# Patient Record
Sex: Female | Born: 2000 | Race: Black or African American | Hispanic: No | Marital: Single | Smoking: Never smoker
Health system: Southern US, Community
[De-identification: ages and names within clinical notes are randomized; demographics above are authoritative.]

## PROBLEM LIST (undated history)

## (undated) DIAGNOSIS — M549 Dorsalgia, unspecified: Secondary | ICD-10-CM

## (undated) DIAGNOSIS — M2550 Pain in unspecified joint: Secondary | ICD-10-CM

## (undated) DIAGNOSIS — R030 Elevated blood-pressure reading, without diagnosis of hypertension: Secondary | ICD-10-CM

## (undated) DIAGNOSIS — E559 Vitamin D deficiency, unspecified: Secondary | ICD-10-CM

## (undated) DIAGNOSIS — E538 Deficiency of other specified B group vitamins: Secondary | ICD-10-CM

## (undated) DIAGNOSIS — L309 Dermatitis, unspecified: Secondary | ICD-10-CM

## (undated) HISTORY — DX: Vitamin D deficiency, unspecified: E55.9

## (undated) HISTORY — DX: Dermatitis, unspecified: L30.9

## (undated) HISTORY — DX: Deficiency of other specified B group vitamins: E53.8

## (undated) HISTORY — DX: Dorsalgia, unspecified: M54.9

## (undated) HISTORY — DX: Pain in unspecified joint: M25.50

## (undated) HISTORY — DX: Elevated blood-pressure reading, without diagnosis of hypertension: R03.0

---
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
## 2020-03-28 ENCOUNTER — Ambulatory Visit: Payer: Medicaid Other | Admitting: Family Medicine

## 2020-03-31 ENCOUNTER — Other Ambulatory Visit: Payer: Self-pay

## 2020-03-31 ENCOUNTER — Ambulatory Visit (INDEPENDENT_AMBULATORY_CARE_PROVIDER_SITE_OTHER): Payer: Medicaid Other | Admitting: Family Medicine

## 2020-03-31 ENCOUNTER — Encounter: Payer: Self-pay | Admitting: Family Medicine

## 2020-03-31 VITALS — BP 108/58 | HR 98 | Ht 66.0 in | Wt 208.2 lb

## 2020-03-31 DIAGNOSIS — L309 Dermatitis, unspecified: Secondary | ICD-10-CM | POA: Diagnosis not present

## 2020-03-31 DIAGNOSIS — Z1159 Encounter for screening for other viral diseases: Secondary | ICD-10-CM

## 2020-03-31 DIAGNOSIS — R42 Dizziness and giddiness: Secondary | ICD-10-CM

## 2020-03-31 DIAGNOSIS — E669 Obesity, unspecified: Secondary | ICD-10-CM | POA: Diagnosis not present

## 2020-03-31 MED ORDER — BETAMETHASONE VALERATE 0.1 % EX OINT: 1.0000 "application " | TOPICAL_OINTMENT | Freq: Two times a day (BID) | CUTANEOUS | 0 refills | Status: DC

## 2020-03-31 NOTE — Assessment & Plan Note (Signed)
Trial of Betamethasone. Med escribed. F/U soon if there is no improvement. I counseled her on lip sucking as a potential trigger for her peri-oral eczema. She will attempt to stop this habit. F/U as needed.

## 2020-03-31 NOTE — Progress Notes (Signed)
    SUBJECTIVE:   CHIEF COMPLAINT / HPI:  Dizziness This is a new problem. Episode onset: Started less than a year ago. The problem occurs intermittently. The problem has been waxing and waning. Pertinent negatives include no anorexia, chest pain, coughing, fever, headaches or vomiting. Associated symptoms comments: Symptoms occurs <3 times weekly, associated with hunger. Sometimes if she feels hot or dehydrated, she feels dizzy. Exacerbated by: Not eating. Treatments tried: She feels better when she eats or drink something cold and rest. Each episodes last for a few seconds to minutes.   Rash: C/O rash on her right posterior knee which has been there for a few months associated with intense itching. Pain from scratching. No exposure. She is out of her topical steroid cream for her eczema. Similar rash around her lower lips. She endorses lip sucking occasionally.  Weight management: Now in boxing class.  PERTINENT  PMH / PSH: PMX reviewed.  OBJECTIVE:   BP (!) 108/58   Pulse 98   Ht 5\' 6"  (1.676 m)   Wt 208 lb 4 oz (94.5 kg)   LMP 03/29/2020   SpO2 100%   BMI 33.61 kg/m   Physical Exam Vitals and nursing note reviewed.  Cardiovascular:     Rate and Rhythm: Normal rate and regular rhythm.     Pulses: Normal pulses.     Heart sounds: Normal heart sounds. No murmur heard.   Pulmonary:     Effort: Pulmonary effort is normal. No respiratory distress.     Breath sounds: Normal breath sounds. No wheezing or rhonchi.  Musculoskeletal:     Right lower leg: No edema.     Left lower leg: No edema.  Skin:    Comments: + dry, hyperpigmented patch on her shin. Large rough, hyperpigmented patch measuring 5 cm by 6 cm on her posterior right knee.        ASSESSMENT/PLAN:   Dizziness: She is currently asymptomatic. BP a bit low. Keep well hydrated and monitor BP closely at home. ?? Hypoglycemia vs anemia as a cause of her symptoms. CBC and Bmet checked today. I encouraged healthy  meal at regular interval. I will contact her with the result. She agreed with the plan.  Eczema Trial of Betamethasone. Med escribed. F/U soon if there is no improvement. I counseled her on lip sucking as a potential trigger for her peri-oral eczema. She will attempt to stop this habit. F/U as needed.  Obesity She had done well with weight loss. Now in a boxing class. I commended her on job well done for losing weight. Continue to monitor closely.    Hep C screening offered.  03/31/2020, MD [facility]

## 2020-03-31 NOTE — Assessment & Plan Note (Signed)
She had done well with weight loss. Now in a boxing class. I commended her on job well done for losing weight. Continue to monitor closely.

## 2020-03-31 NOTE — Patient Instructions (Signed)

## 2020-04-01 ENCOUNTER — Encounter: Payer: Self-pay | Admitting: Family Medicine

## 2020-04-01 LAB — BASIC METABOLIC PANEL
BUN/Creatinine Ratio: 12 (ref 9–23)
BUN: 9 mg/dL (ref 6–20)
CO2: 22 mmol/L (ref 20–29)
Calcium: 9.3 mg/dL (ref 8.7–10.2)
Chloride: 108 mmol/L — ABNORMAL HIGH (ref 96–106)
Creatinine, Ser: 0.74 mg/dL (ref 0.57–1.00)
GFR calc Af Amer: 136 mL/min/{1.73_m2} (ref >59)
GFR calc non Af Amer: 118 mL/min/{1.73_m2} (ref >59)
Glucose: 100 mg/dL — ABNORMAL HIGH (ref 65–99)
Potassium: 3.8 mmol/L (ref 3.5–5.2)
Sodium: 141 mmol/L (ref 134–144)

## 2020-04-01 LAB — CBC
Hematocrit: 37.1 % (ref 34.0–46.6)
Hemoglobin: 12.3 g/dL (ref 11.1–15.9)
MCH: 30.3 pg (ref 26.6–33.0)
MCHC: 33.2 g/dL (ref 31.5–35.7)
MCV: 91 fL (ref 79–97)
Platelets: 274 10*3/uL (ref 150–450)
RBC: 4.06 x10E6/uL (ref 3.77–5.28)
RDW: 12.2 % (ref 11.7–15.4)
WBC: 6.2 10*3/uL (ref 3.4–10.8)

## 2020-04-01 LAB — HEPATITIS C ANTIBODY: Hep C Virus Ab: <0.1 s/co ratio (ref 0.0–0.9)

## 2020-04-03 ENCOUNTER — Telehealth: Payer: Self-pay | Admitting: Family Medicine

## 2020-04-03 NOTE — Telephone Encounter (Signed)
Result discussed.  Glucose is ok.  Return soon if EKG if symptoms persists. She will contact me soon to schedule f/u appointment.

## 2020-04-04 ENCOUNTER — Encounter: Payer: Self-pay | Admitting: Family Medicine

## 2020-04-05 ENCOUNTER — Encounter: Payer: Self-pay | Admitting: Family Medicine

## 2020-04-05 NOTE — Telephone Encounter (Signed)
I called and spoke with the patient. She stated that she felt dizzy yesterday and that was why she wanted me to call. ED precaution discussed, otherwise, I will see her at her next appointment.  She agreed with the plan.

## 2020-04-07 ENCOUNTER — Encounter: Payer: Self-pay | Admitting: Family Medicine

## 2020-04-07 ENCOUNTER — Other Ambulatory Visit: Payer: Self-pay

## 2020-04-07 ENCOUNTER — Ambulatory Visit (HOSPITAL_COMMUNITY)
Admission: RE | Admit: 2020-04-07 | Discharge: 2020-04-07 | Disposition: A | Discharge status: Home / Self Care | Payer: Medicaid Other | Source: Ambulatory Visit | Attending: Family Medicine | Admitting: Family Medicine

## 2020-04-07 ENCOUNTER — Ambulatory Visit (INDEPENDENT_AMBULATORY_CARE_PROVIDER_SITE_OTHER): Payer: Medicaid Other | Admitting: Family Medicine

## 2020-04-07 VITALS — BP 110/72 | HR 59 | Wt 205.0 lb

## 2020-04-07 DIAGNOSIS — R55 Syncope and collapse: Secondary | ICD-10-CM

## 2020-04-07 DIAGNOSIS — R42 Dizziness and giddiness: Secondary | ICD-10-CM | POA: Insufficient documentation

## 2020-04-07 DIAGNOSIS — E539 Vitamin B deficiency, unspecified: Secondary | ICD-10-CM

## 2020-04-07 DIAGNOSIS — E8889 Other specified metabolic disorders: Secondary | ICD-10-CM

## 2020-04-07 DIAGNOSIS — R5383 Other fatigue: Secondary | ICD-10-CM | POA: Diagnosis not present

## 2020-04-07 DIAGNOSIS — K219 Gastro-esophageal reflux disease without esophagitis: Secondary | ICD-10-CM | POA: Diagnosis not present

## 2020-04-07 DIAGNOSIS — R1013 Epigastric pain: Secondary | ICD-10-CM | POA: Insufficient documentation

## 2020-04-07 DIAGNOSIS — E559 Vitamin D deficiency, unspecified: Secondary | ICD-10-CM

## 2020-04-07 MED ORDER — FAMOTIDINE 20 MG PO TABS: 20.0000 mg | ORAL_TABLET | Freq: Every day | ORAL | 1 refills | Status: DC

## 2020-04-07 NOTE — Patient Instructions (Signed)

## 2020-04-07 NOTE — Assessment & Plan Note (Signed)
Her symptoms is consistent with GERD. Trial of Pepcid. H. Pylori breath test done today. I will contact her with the result. Pepcid escribed.

## 2020-04-07 NOTE — Progress Notes (Signed)
° ° °  SUBJECTIVE:   CHIEF COMPLAINT / HPI:  Presyncope: Patient continues to have intermittent light headedness as if she will faint. She denies chest pain, no SOB at the time. Her last episode was about a week ago. No neuro symptoms. She  Denies dizziness this morning. She feels weak sometimes.  Abdominal pain:C/O epigastric pain on and off for months. Worsens when she skips her meal. Denies N/V, no change in her bowel habit. She described pain as burning and aching in nature. There is associated poor appetite.   PERTINENT  PMH / PSH: PMX reviewed.  OBJECTIVE:   BP 110/72    Pulse (!) 59    Wt 205 lb (93 kg)    LMP 03/29/2020    BMI 33.09 kg/m   Physical Exam Vitals and nursing note reviewed.  Cardiovascular:     Rate and Rhythm: Normal rate and regular rhythm.     Heart sounds: Normal heart sounds. No murmur heard.   Pulmonary:     Effort: Pulmonary effort is normal. No respiratory distress.     Breath sounds: Normal breath sounds. No wheezing.  Abdominal:     General: Abdomen is flat. Bowel sounds are normal. There is no distension.     Palpations: Abdomen is soft. There is no mass.     Tenderness: There is no abdominal tenderness.  Neurological:     General: No focal deficit present.     Mental Status: She is oriented to person, place, and time.     Coordination: Coordination normal.     Gait: Gait normal.      ASSESSMENT/PLAN:   Dizziness BP looks good. Recent Bmet and Hemoglobin looks good. HR slow.  EKG dne today reviewed by me showed sinus brady. ?? Dizziness related to her heart rate. Cardiology evaluation and referral recommended. She declined cards eval or referral for now. She will reconsider this if her symptoms persists or worsens. She will contact or office via phone/MyChart if she has any questions or go to the ED. TSH, Vitamin B12 and D checked today given hx of fatigue and weakness. I will her soon with her result.  GERD (gastroesophageal reflux  disease) Her symptoms is consistent with GERD. Trial of Pepcid. H. Pylori breath test done today. I will contact her with the result. Pepcid escribed.     Janit Pagan, MD [facility]

## 2020-04-07 NOTE — Assessment & Plan Note (Addendum)
BP looks good. Recent Bmet and Hemoglobin looks good. HR slow.  EKG dne today reviewed by me showed sinus brady. ?? Dizziness related to her heart rate. Cardiology evaluation and referral recommended. She declined cards eval or referral for now. She will reconsider this if her symptoms persists or worsens. She will contact or office via phone/MyChart if she has any questions or go to the ED. TSH, Vitamin B12 and D checked today given hx of fatigue and weakness. I will her soon with her result.

## 2020-04-08 ENCOUNTER — Encounter: Payer: Self-pay | Admitting: Family Medicine

## 2020-04-08 ENCOUNTER — Other Ambulatory Visit: Payer: Self-pay | Admitting: Family Medicine

## 2020-04-08 DIAGNOSIS — E559 Vitamin D deficiency, unspecified: Secondary | ICD-10-CM | POA: Insufficient documentation

## 2020-04-08 DIAGNOSIS — E539 Vitamin B deficiency, unspecified: Secondary | ICD-10-CM | POA: Insufficient documentation

## 2020-04-08 LAB — H. PYLORI BREATH TEST: H pylori Breath Test: NEGATIVE

## 2020-04-08 LAB — VITAMIN B12: Vitamin B-12: 194 pg/mL — ABNORMAL LOW (ref 232–1245)

## 2020-04-08 LAB — VITAMIN D 25 HYDROXY (VIT D DEFICIENCY, FRACTURES): Vit D, 25-Hydroxy: 9.6 ng/mL — ABNORMAL LOW (ref 30.0–100.0)

## 2020-04-08 LAB — TSH: TSH: 0.931 u[IU]/mL (ref 0.450–4.500)

## 2020-04-08 MED ORDER — VITAMIN D (ERGOCALCIFEROL) 1.25 MG (50000 UNIT) PO CAPS: 50000.0000 [IU] | ORAL_CAPSULE | ORAL | 1 refills | Status: DC

## 2020-04-10 ENCOUNTER — Telehealth: Payer: Self-pay | Admitting: *Deleted

## 2020-04-10 ENCOUNTER — Other Ambulatory Visit: Payer: Self-pay | Admitting: Family Medicine

## 2020-04-10 MED ORDER — CYANOCOBALAMIN 500 MCG PO TABS
500.0000 ug | ORAL_TABLET | Freq: Every day | ORAL | 1 refills | Status: DC
Start: 2020-04-10 — End: 2020-06-28

## 2020-04-10 NOTE — Telephone Encounter (Signed)
Attempted to call patient to schedule nurse visit, but no answer and voicemail is not set up.  Will send her a [employer].  Verle Brillhart,CMA

## 2020-04-10 NOTE — Telephone Encounter (Signed)
Spoke with patient regarding her options for B12.  She would like to try taking oral B12 until she comes in for her follow up in 2 months.  Patient is going to consider the injections and will also check with work on getting the time off needed for weekly/monthly visits.  Patient would like to have a script sent to her pharmacy of the desired dose she should be taking of the oral medication.  Will forward to MD.  Pharmacy confirmed.  Jaisean Monteforte,CMA

## 2020-04-10 NOTE — Telephone Encounter (Signed)
-----   Message from Doreene Eland, MD sent at 04/08/2020  9:35 AM EDT ----- Please contact patient to schedule Vitamin B injections.  Vitamin B 12 1000 mcg IM  every week x 2 weeks and then schedule monthly.

## 2020-04-10 NOTE — Telephone Encounter (Signed)
Called patient but was unable to reach due to her being at work.  Sent her a [employer].  Edahi Kroening,CMA

## 2020-04-10 NOTE — Telephone Encounter (Signed)
Her level is too low for oral Vitamin B. However if she prefers that, she can get med OTC.  The OTC oral vitamin B12 is dietary supplement , not sufficient to treat vitamin B12 deficiency.

## 2020-04-10 NOTE — Telephone Encounter (Signed)
Spoke with patient via mychart and she would like to know if she can get a script for oral B12 vs the injections.  Chonita Gadea,CMA

## 2020-04-10 NOTE — Telephone Encounter (Signed)
I called and discussed with the patient.  I gave her a monthly vitamin B12 injection instead of weekly x 2 weeks given the concern about her work schedule. She still prefers to trial oral supplement which is mainly for maintenance dosing.  I escribed Cyanocobalamin 500 mcg qd to her pharmacy. She will f/u in 1 month for repeat lab.

## 2020-04-21 ENCOUNTER — Encounter: Payer: Self-pay | Admitting: Family Medicine

## 2020-05-30 ENCOUNTER — Ambulatory Visit: Payer: Medicaid Other | Admitting: Family Medicine

## 2020-06-15 ENCOUNTER — Other Ambulatory Visit: Payer: Self-pay | Admitting: Family Medicine

## 2020-06-28 ENCOUNTER — Other Ambulatory Visit: Payer: Self-pay | Admitting: Family Medicine

## 2020-07-29 ENCOUNTER — Other Ambulatory Visit: Payer: Self-pay | Admitting: Family Medicine

## 2020-08-08 ENCOUNTER — Ambulatory Visit (INDEPENDENT_AMBULATORY_CARE_PROVIDER_SITE_OTHER): Payer: Medicaid Other | Admitting: Family Medicine

## 2020-08-08 ENCOUNTER — Encounter: Payer: Self-pay | Admitting: Family Medicine

## 2020-08-08 ENCOUNTER — Other Ambulatory Visit: Payer: Self-pay

## 2020-08-08 VITALS — BP 100/60 | HR 79 | Ht 66.0 in | Wt 206.1 lb

## 2020-08-08 DIAGNOSIS — Z23 Encounter for immunization: Secondary | ICD-10-CM | POA: Diagnosis not present

## 2020-08-08 DIAGNOSIS — R413 Other amnesia: Secondary | ICD-10-CM

## 2020-08-08 DIAGNOSIS — Z0000 Encounter for general adult medical examination without abnormal findings: Secondary | ICD-10-CM | POA: Diagnosis not present

## 2020-08-08 DIAGNOSIS — E539 Vitamin B deficiency, unspecified: Secondary | ICD-10-CM | POA: Diagnosis not present

## 2020-08-08 DIAGNOSIS — E559 Vitamin D deficiency, unspecified: Secondary | ICD-10-CM

## 2020-08-08 DIAGNOSIS — Z113 Encounter for screening for infections with a predominantly sexual mode of transmission: Secondary | ICD-10-CM

## 2020-08-08 NOTE — Progress Notes (Signed)
Subjective:     Rose Butler is a 20 y.o. female and is here for a comprehensive physical exam. The patient reports problems - memory issue x 6 months. Gradually worsening..  Forgets small details> No recent stress. Associated with headaches daily - Hx of Migraine headache which is similar to her current headache.  Concern about eating disorder. Feels like she does not have adequate calorie intake due to mental stress regarding her weight. Social History   Socioeconomic History  . Marital status: Single    Spouse name: Not on file  . Number of children: Not on file  . Years of education: Not on file  . Highest education level: Not on file  Occupational History  . Occupation: [employer]  Tobacco Use  . Smoking status: Never Smoker  . Smokeless tobacco: Never Used  Vaping Use  . Vaping Use: Never used  Substance and Sexual Activity  . Alcohol use: No  . Drug use: No  . Sexual activity: Not on file  Other Topics Concern  . Not on file  Social History Narrative   Lives with mom, sister and niece   Social Determinants of Health   Financial Resource Strain: Not on file  Food Insecurity: Not on file  Transportation Needs: Not on file  Physical Activity: Not on file  Stress: Not on file  Social Connections: Not on file  Intimate Partner Violence: Not on file   Health Maintenance  Topic Date Due  . INFLUENZA VACCINE  09/07/2020 (Originally 01/09/2020)  . COVID-19 Vaccine (1) 11/10/2020 (Originally 01/28/2006)  . TETANUS/TDAP  02/16/2022  . HPV VACCINES  Completed  . Hepatitis C Screening  Completed  . HIV Screening  Completed    The following portions of the patient's history were reviewed and updated as appropriate: allergies, current medications, past family history, past medical history, past social history, past surgical history and problem list.  Review of Systems Pertinent items noted in HPI and remainder of comprehensive ROS otherwise negative.   Objective:    BP  100/60   Pulse 79   Ht 5\' 6"  (1.676 m)   Wt 206 lb 2 oz (93.5 kg)   LMP 07/18/2020   SpO2 99%   BMI 33.27 kg/m  General appearance: alert and cooperative Head: Normocephalic, without obvious abnormality, atraumatic Eyes: conjunctivae/corneas clear. PERRL, EOM's intact. Fundi benign. Ears: normal TM's and external ear canals both ears Throat: lips, mucosa, and tongue normal; teeth and gums normal Neck: no adenopathy, no carotid bruit, no JVD, supple, symmetrical, trachea midline and thyroid not enlarged, symmetric, no tenderness/mass/nodules Lungs: clear to auscultation bilaterally Heart: regular rate and rhythm, S1, S2 normal, no murmur, click, rub or gallop Abdomen: soft, non-tender; bowel sounds normal; no masses,  no organomegaly Extremities: extremities normal, atraumatic, no cyanosis or edema Lymph nodes: Cervical, supraclavicular, and axillary nodes normal. Neurologic: Alert and oriented X 3, normal strength and tone. Normal symmetric reflexes. Normal coordination and gait Mental status: Alert, oriented, thought content appropriate Sensory: normal Gait: Normal    Flowsheet Row Office Visit from 08/08/2020 in [facility]  PHQ-9 Total Score 3      Assessment:    Healthy female exam.     Migraine: Chronic recurrent Memory concern: New Eating disorder Plan:  Otherwise normal well exam. Flu shot offered and was given today. Weight management reviewed.  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 She will benefit from CBT which she is interested in. She will like  to get connect with Dr. Shawnee Knapp. I will message Shawnee Knapp to connect with this patient.  Memory issue: Mild May be stress or anxiety related. No neurologic deficit on exam RPR checked as well as Vit B12 and D given previous deficiencies. She had a recently normal TSH and HIV test, hence, not repeated. Consider imaging if lab is none revealing. She agreed with the  plan.   See After Visit Summary for Counseling Recommendations

## 2020-08-08 NOTE — Patient Instructions (Signed)
It was nice seeing you today. For your chronic migraine headache, please trial Magnesium Oxide 400 mg daily as needed. You can get this medicine over the counter. Please call soon or go to the emergency of the headache worsens. I will call you soon regarding your test results.

## 2020-08-09 ENCOUNTER — Telehealth: Payer: Self-pay | Admitting: Family Medicine

## 2020-08-09 LAB — RPR: RPR Ser Ql: NONREACTIVE

## 2020-08-09 LAB — VITAMIN B12: Vitamin B-12: 769 pg/mL (ref 232–1245)

## 2020-08-09 LAB — VITAMIN D 25 HYDROXY (VIT D DEFICIENCY, FRACTURES): Vit D, 25-Hydroxy: 29.1 ng/mL — ABNORMAL LOW (ref 30.0–100.0)

## 2020-08-09 NOTE — Telephone Encounter (Signed)
Second attempt to reach patient.  I was unable to leave a voice message.  Should she call back, please, advise her that her vitamin B12 level is back to normal.  Vitamin D improved some.  RPR for syphilis screening is still pending.  Plan: May use OTC MVI May use OTC vitamin D daily supplement. F/U soon if still having memory issues or wants to proceed with an MRI of the brain.

## 2020-08-09 NOTE — Telephone Encounter (Signed)
Unable to leave a message.   Will call back to discuss result.

## 2020-08-09 NOTE — Telephone Encounter (Signed)
Patient returns PCP to nurse line. Patient advised of results and OTC vitamin recommendations. Patient advised RPR is still pending and will reach out with results as soon as they are back.

## 2020-08-13 ENCOUNTER — Other Ambulatory Visit: Payer: Self-pay | Admitting: Family Medicine

## 2020-08-25 ENCOUNTER — Ambulatory Visit: Payer: Medicaid Other | Admitting: Family Medicine

## 2020-08-29 ENCOUNTER — Encounter: Payer: Self-pay | Admitting: Family Medicine

## 2020-08-29 ENCOUNTER — Ambulatory Visit (INDEPENDENT_AMBULATORY_CARE_PROVIDER_SITE_OTHER): Payer: Medicaid Other | Admitting: Family Medicine

## 2020-08-29 ENCOUNTER — Other Ambulatory Visit: Payer: Self-pay

## 2020-08-29 DIAGNOSIS — N62 Hypertrophy of breast: Secondary | ICD-10-CM

## 2020-08-29 DIAGNOSIS — J301 Allergic rhinitis due to pollen: Secondary | ICD-10-CM

## 2020-08-29 DIAGNOSIS — M25519 Pain in unspecified shoulder: Secondary | ICD-10-CM

## 2020-08-29 DIAGNOSIS — L309 Dermatitis, unspecified: Secondary | ICD-10-CM | POA: Diagnosis not present

## 2020-08-29 MED ORDER — CYCLOBENZAPRINE HCL 5 MG PO TABS: 5.0000 mg | ORAL_TABLET | Freq: Two times a day (BID) | ORAL | 1 refills | Status: DC | PRN

## 2020-08-29 MED ORDER — LEVOCETIRIZINE DIHYDROCHLORIDE 5 MG PO TABS: 5.0000 mg | ORAL_TABLET | Freq: Every evening | ORAL | 1 refills | Status: DC

## 2020-08-29 MED ORDER — BETAMETHASONE VALERATE 0.1 % EX OINT: 1.0000 "application " | TOPICAL_OINTMENT | Freq: Two times a day (BID) | CUTANEOUS | 3 refills | Status: DC

## 2020-08-29 NOTE — Progress Notes (Signed)
    SUBJECTIVE:   CHIEF COMPLAINT / HPI:   Shoulder pain: Here for f/u. Hx of b/L shoulder pain worse on the left. She attributed her pain to the heaviness of her breast. Pain right now is about 4/10 in severity. Pain can be worsened by activity. No recent trauma.  Eczema/Allergy: Requesting medication refill - Betamethasone. Her sinus problem is acting up due to pollen exposure. She tired Claritin and Zyrtec in the past with no improvement. She experiences stuffy nose, HA sometimes.  PERTINENT  PMH / PSH: PMX reviewed.  OBJECTIVE:   BP 118/65   Pulse 90   Ht 5\' 6"  (1.676 m)   Wt 213 lb 3.2 oz (96.7 kg)   LMP 08/15/2020   SpO2 99%   BMI 34.41 kg/m   Physical Exam Vitals and nursing note reviewed.  Cardiovascular:     Rate and Rhythm: Normal rate and regular rhythm.     Heart sounds: Normal heart sounds. No murmur heard.   Pulmonary:     Effort: Pulmonary effort is normal. No respiratory distress.     Breath sounds: Normal breath sounds. No wheezing.  Abdominal:     General: Abdomen is flat. Bowel sounds are normal.     Palpations: Abdomen is soft.     Tenderness: There is no abdominal tenderness.  Musculoskeletal:     Right shoulder: Normal.     Left shoulder: No swelling or deformity. Normal range of motion.     Comments: Mild tenderness and tenseness of her left trapezius.       ASSESSMENT/PLAN:   Pain in joint, shoulder region B/L due to over use - heavy breast PT referral discussed - she would consider in the future Home shoulder exercise handout provided. Use Tylenol as needed for pain. Flexeril escribed prn muscle spasm and pain. Need referral to surgery for breast reduction which will improve her quality of life.  Macromastia Referred to plastic surgery in the past. She was unable to proceed with breast reduction due to insurance restriction. She will call her insurance regarding referral and let me know.  Eczema I refilled her Betamethasone.  F/U  as needed.  Allergic rhinitis Trial Xyzal. Med escribed. F/U soon.     10/15/2020, MD [facility]

## 2020-08-29 NOTE — Assessment & Plan Note (Signed)
Trial Xyzal. Med escribed. F/U soon.

## 2020-08-29 NOTE — Assessment & Plan Note (Signed)
I refilled her Betamethasone.  F/U as needed.

## 2020-08-29 NOTE — Assessment & Plan Note (Addendum)
B/L due to over use - heavy breast PT referral discussed - she would consider in the future Home shoulder exercise handout provided. Use Tylenol as needed for pain. Flexeril escribed prn muscle spasm and pain. Need referral to surgery for breast reduction which will improve her quality of life.

## 2020-08-29 NOTE — Assessment & Plan Note (Signed)
Referred to plastic surgery in the past. She was unable to proceed with breast reduction due to insurance restriction. She will call her insurance regarding referral and let me know.

## 2020-08-29 NOTE — Patient Instructions (Signed)
Shoulder Exercises Ask your health care provider which exercises are safe for you. Do exercises exactly as told by your health care provider and adjust them as directed. It is normal to feel mild stretching, pulling, tightness, or discomfort as you do these exercises. Stop right away if you feel sudden pain or your pain gets worse. Do not begin these exercises until told by your health care provider. Stretching exercises External rotation and abduction This exercise is sometimes called corner stretch. This exercise rotates your arm outward (external rotation) and moves your arm out from your body (abduction). 1. Stand in a doorway with one of your feet slightly in front of the other. This is called a staggered stance. If you cannot reach your forearms to the door frame, stand facing a corner of a room. 2. Choose one of the following positions as told by your health care provider: ? Place your hands and forearms on the door frame above your head. ? Place your hands and forearms on the door frame at the height of your head. ? Place your hands on the door frame at the height of your elbows. 3. Slowly move your weight onto your front foot until you feel a stretch across your chest and in the front of your shoulders. Keep your head and chest upright and keep your abdominal muscles tight. 4. Hold for __________ seconds. 5. To release the stretch, shift your weight to your back foot. Repeat __________ times. Complete this exercise __________ times a day.   Extension, standing 1. Stand and hold a broomstick, a cane, or a similar object behind your back. ? Your hands should be a little wider than shoulder width apart. ? Your palms should face away from your back. 2. Keeping your elbows straight and your shoulder muscles relaxed, move the stick away from your body until you feel a stretch in your shoulders (extension). ? Avoid shrugging your shoulders while you move the stick. Keep your shoulder blades  tucked down toward the middle of your back. 3. Hold for __________ seconds. 4. Slowly return to the starting position. Repeat __________ times. Complete this exercise __________ times a day. Range-of-motion exercises Pendulum 1. Stand near a wall or a surface that you can hold onto for balance. 2. Bend at the waist and let your left / right arm hang straight down. Use your other arm to support you. Keep your back straight and do not lock your knees. 3. Relax your left / right arm and shoulder muscles, and move your hips and your trunk so your left / right arm swings freely. Your arm should swing because of the motion of your body, not because you are using your arm or shoulder muscles. 4. Keep moving your hips and trunk so your arm swings in the following directions, as told by your health care provider: ? Side to side. ? Forward and backward. ? In clockwise and counterclockwise circles. 5. Continue each motion for __________ seconds, or for as long as told by your health care provider. 6. Slowly return to the starting position. Repeat __________ times. Complete this exercise __________ times a day.   Shoulder flexion, standing 1. Stand and hold a broomstick, a cane, or a similar object. Place your hands a little more than shoulder width apart on the object. Your left / right hand should be palm up, and your other hand should be palm down. 2. Keep your elbow straight and your shoulder muscles relaxed. Push the stick up with your healthy   arm to raise your left / right arm in front of your body, and then over your head until you feel a stretch in your shoulder (flexion). ? Avoid shrugging your shoulder while you raise your arm. Keep your shoulder blade tucked down toward the middle of your back. 3. Hold for __________ seconds. 4. Slowly return to the starting position. Repeat __________ times. Complete this exercise __________ times a day.   Shoulder abduction, standing 1. Stand and hold a  broomstick, a cane, or a similar object. Place your hands a little more than shoulder width apart on the object. Your left / right hand should be palm up, and your other hand should be palm down. 2. Keep your elbow straight and your shoulder muscles relaxed. Push the object across your body toward your left / right side. Raise your left / right arm to the side of your body (abduction) until you feel a stretch in your shoulder. ? Do not raise your arm above shoulder height unless your health care provider tells you to do that. ? If directed, raise your arm over your head. ? Avoid shrugging your shoulder while you raise your arm. Keep your shoulder blade tucked down toward the middle of your back. 3. Hold for __________ seconds. 4. Slowly return to the starting position. Repeat __________ times. Complete this exercise __________ times a day. Internal rotation 1. Place your left / right hand behind your back, palm up. 2. Use your other hand to dangle an exercise band, a towel, or a similar object over your shoulder. Grasp the band with your left / right hand so you are holding on to both ends. 3. Gently pull up on the band until you feel a stretch in the front of your left / right shoulder. The movement of your arm toward the center of your body is called internal rotation. ? Avoid shrugging your shoulder while you raise your arm. Keep your shoulder blade tucked down toward the middle of your back. 4. Hold for __________ seconds. 5. Release the stretch by letting go of the band and lowering your hands. Repeat __________ times. Complete this exercise __________ times a day.   Strengthening exercises External rotation 1. Sit in a stable chair without armrests. 2. Secure an exercise band to a stable object at elbow height on your left / right side. 3. Place a soft object, such as a folded towel or a small pillow, between your left / right upper arm and your body to move your elbow about 4 inches (10  cm) away from your side. 4. Hold the end of the exercise band so it is tight and there is no slack. 5. Keeping your elbow pressed against the soft object, slowly move your forearm out, away from your abdomen (external rotation). Keep your body steady so only your forearm moves. 6. Hold for __________ seconds. 7. Slowly return to the starting position. Repeat __________ times. Complete this exercise __________ times a day.   Shoulder abduction 1. Sit in a stable chair without armrests, or stand up. 2. Hold a __________ weight in your left / right hand, or hold an exercise band with both hands. 3. Start with your arms straight down and your left / right palm facing in, toward your body. 4. Slowly lift your left / right hand out to your side (abduction). Do not lift your hand above shoulder height unless your health care provider tells you that this is safe. ? Keep your arms straight. ? Avoid   shrugging your shoulder while you do this movement. Keep your shoulder blade tucked down toward the middle of your back. 5. Hold for __________ seconds. 6. Slowly lower your arm, and return to the starting position. Repeat __________ times. Complete this exercise __________ times a day.   Shoulder extension 1. Sit in a stable chair without armrests, or stand up. 2. Secure an exercise band to a stable object in front of you so it is at shoulder height. 3. Hold one end of the exercise band in each hand. Your palms should face each other. 4. Straighten your elbows and lift your hands up to shoulder height. 5. Step back, away from the secured end of the exercise band, until the band is tight and there is no slack. 6. Squeeze your shoulder blades together as you pull your hands down to the sides of your thighs (extension). Stop when your hands are straight down by your sides. Do not let your hands go behind your body. 7. Hold for __________ seconds. 8. Slowly return to the starting position. Repeat __________  times. Complete this exercise __________ times a day. Shoulder row 1. Sit in a stable chair without armrests, or stand up. 2. Secure an exercise band to a stable object in front of you so it is at waist height. 3. Hold one end of the exercise band in each hand. Position your palms so that your thumbs are facing the ceiling (neutral position). 4. Bend each of your elbows to a 90-degree angle (right angle) and keep your upper arms at your sides. 5. Step back until the band is tight and there is no slack. 6. Slowly pull your elbows back behind you. 7. Hold for __________ seconds. 8. Slowly return to the starting position. Repeat __________ times. Complete this exercise __________ times a day. Shoulder press-ups 1. Sit in a stable chair that has armrests. Sit upright, with your feet flat on the floor. 2. Put your hands on the armrests so your elbows are bent and your fingers are pointing forward. Your hands should be about even with the sides of your body. 3. Push down on the armrests and use your arms to lift yourself off the chair. Straighten your elbows and lift yourself up as much as you comfortably can. ? Move your shoulder blades down, and avoid letting your shoulders move up toward your ears. ? Keep your feet on the ground. As you get stronger, your feet should support less of your body weight as you lift yourself up. 4. Hold for __________ seconds. 5. Slowly lower yourself back into the chair. Repeat __________ times. Complete this exercise __________ times a day.   Wall push-ups 1. Stand so you are facing a stable wall. Your feet should be about one arm-length away from the wall. 2. Lean forward and place your palms on the wall at shoulder height. 3. Keep your feet flat on the floor as you bend your elbows and lean forward toward the wall. 4. Hold for __________ seconds. 5. Straighten your elbows to push yourself back to the starting position. Repeat __________ times. Complete this  exercise __________ times a day.   This information is not intended to replace advice given to you by your health care provider. Make sure you discuss any questions you have with your health care provider. Document Revised: 09/18/2018 Document Reviewed: 06/26/2018 Elsevier Patient Education  2021 [employer]  

## 2020-09-30 ENCOUNTER — Other Ambulatory Visit: Payer: Self-pay | Admitting: Family Medicine

## 2021-07-11 ENCOUNTER — Other Ambulatory Visit: Payer: Self-pay

## 2021-07-11 ENCOUNTER — Encounter: Payer: Self-pay | Admitting: Family Medicine

## 2021-07-11 ENCOUNTER — Ambulatory Visit (INDEPENDENT_AMBULATORY_CARE_PROVIDER_SITE_OTHER): Payer: Medicaid Other | Admitting: Family Medicine

## 2021-07-11 VITALS — BP 132/84 | HR 78 | Ht 66.0 in | Wt 202.4 lb

## 2021-07-11 DIAGNOSIS — N939 Abnormal uterine and vaginal bleeding, unspecified: Secondary | ICD-10-CM | POA: Diagnosis not present

## 2021-07-11 DIAGNOSIS — H547 Unspecified visual loss: Secondary | ICD-10-CM

## 2021-07-11 DIAGNOSIS — E559 Vitamin D deficiency, unspecified: Secondary | ICD-10-CM | POA: Diagnosis not present

## 2021-07-11 DIAGNOSIS — H6123 Impacted cerumen, bilateral: Secondary | ICD-10-CM | POA: Diagnosis not present

## 2021-07-11 DIAGNOSIS — E538 Deficiency of other specified B group vitamins: Secondary | ICD-10-CM

## 2021-07-11 DIAGNOSIS — E539 Vitamin B deficiency, unspecified: Secondary | ICD-10-CM

## 2021-07-11 NOTE — Progress Notes (Signed)
° ° °  SUBJECTIVE:   CHIEF COMPLAINT / HPI:   Hearing/Vision issue: C/O muffled hearing in her left ear x 1 month. No pain, no ear discharge. She tries to clean her ears with q-tips with minimal improvement. She also c/o poor vision x 2 months, especially with distance. She denies ear or eye pain.  Vitamin Def: She d/ced her vitamin D and B12 supplements. She is here for lab check.  Irregular period: She had a one-time episode of spotting on 1.28/23 but none since then. She denies pelvic or abdominal pain. She is sexually active only with a female partner and assured me she is not pregnant.   PERTINENT  PMH / PSH: PMHx reviewed  OBJECTIVE:   Vitals:   07/11/21 0843 07/11/21 0907  BP: 134/90 132/84  Pulse: 78   SpO2: 99%   Weight: 202 lb 6.4 oz (91.8 kg)   Height: 5\' 6"  (1.676 m)     Physical Exam Vitals and nursing note reviewed.  HENT:     Head:     Comments: B/L cerumen impaction worse on he right. Otherwise, normal ear canals Eyes:     Extraocular Movements: Extraocular movements intact.     Conjunctiva/sclera: Conjunctivae normal.     Pupils: Pupils are equal, round, and reactive to light.  Cardiovascular:     Rate and Rhythm: Normal rate and regular rhythm.     Heart sounds: Normal heart sounds. No murmur heard. Pulmonary:     Effort: Pulmonary effort is normal. No respiratory distress.     Breath sounds: Normal breath sounds. No wheezing.  Abdominal:     General: Abdomen is flat. Bowel sounds are normal. There is no distension.     Palpations: Abdomen is soft. There is no mass.     Tenderness: There is no abdominal tenderness.   Hearing Screening   500Hz  1000Hz  2000Hz  4000Hz   Right ear  Pass Pass   Left ear Pass Pass Pass Pass   Vision Screening   Right eye Left eye Both eyes  Without correction 20/25 20/25 20/20   With correction        ASSESSMENT/PLAN:  Vision problem Visual acuity looks good. Referral placed to ophthalmology for  reassessment.  Cerumen impaction Worse on the right although she is more symptomatic on the left. Ear lavage completed and her symptoms improved afterward. F/U as needed.  Vitamin B deficiency Lab checked. I will contact her with the result.  Vitamin D deficiency Lab checked. I will contact her with the result.    Vaginal spotting: One episode. She is currently asymptomatic. Urine preg not requirement. Monitor for now. Plan to obtain pelvic U/S and testing in the future if this reoccurs.  She declined COVID-19 and flu shots  , MD [facility]

## 2021-07-11 NOTE — Assessment & Plan Note (Signed)
Lab checked. I will contact her with the result.

## 2021-07-11 NOTE — Patient Instructions (Signed)
Earwax Buildup, Adult ?The ears produce a substance called earwax that helps keep bacteria out of the ear and protects the skin in the ear canal. Occasionally, earwax can build up in the ear and cause discomfort or hearing loss. ?What are the causes? ?This condition is caused by a buildup of earwax. Ear canals are self-cleaning. Ear wax is made in the outer part of the ear canal and generally falls out in small amounts over time. ?When the self-cleaning mechanism is not working, earwax builds up and can cause decreased hearing and discomfort. Attempting to clean ears with cotton swabs can push the earwax deep into the ear canal and cause decreased hearing and pain. ?What increases the risk? ?This condition is more likely to develop in people who: ?Clean their ears often with cotton swabs. ?Pick at their ears. ?Use earplugs or in-ear headphones often, or wear hearing aids. ?The following factors may also make you more likely to develop this condition: ?Being female. ?Being of older age. ?Naturally producing more earwax. ?Having narrow ear canals. ?Having earwax that is overly thick or sticky. ?Having excess hair in the ear canal. ?Having eczema. ?Being dehydrated. ?What are the signs or symptoms? ?Symptoms of this condition include: ?Reduced or muffled hearing. ?A feeling of fullness in the ear or feeling that the ear is plugged. ?Fluid coming from the ear. ?Ear pain or an itchy ear. ?Ringing in the ear. ?Coughing. ?Balance problems. ?An obvious piece of earwax that can be seen inside the ear canal. ?How is this diagnosed? ?This condition may be diagnosed based on: ?Your symptoms. ?Your medical history. ?An ear exam. During the exam, your health care provider will look into your ear with an instrument called an otoscope. ?You may have tests, including a hearing test. ?How is this treated? ?This condition may be treated by: ?Using ear drops to soften the earwax. ?Having the earwax removed by a health care provider. The  health care provider may: ?Flush the ear with water. ?Use an instrument that has a loop on the end (curette). ?Use a suction device. ?Having surgery to remove the wax buildup. This may be done in severe cases. ?Follow these instructions at home: ? ?Take over-the-counter and prescription medicines only as told by your health care provider. ?Do not put any objects, including cotton swabs, into your ear. You can clean the opening of your ear canal with a washcloth or facial tissue. ?Follow instructions from your health care provider about cleaning your ears. Do not overclean your ears. ?Drink enough fluid to keep your urine pale yellow. This will help to thin the earwax. ?Keep all follow-up visits as told. If earwax builds up in your ears often or if you use hearing aids, consider seeing your health care provider for routine, preventive ear cleanings. Ask your health care provider how often you should schedule your cleanings. ?If you have hearing aids, clean them according to instructions from the manufacturer and your health care provider. ?Contact a health care provider if: ?You have ear pain. ?You develop a fever. ?You have pus or other fluid coming from your ear. ?You have hearing loss. ?You have ringing in your ears that does not go away. ?You feel like the room is spinning (vertigo). ?Your symptoms do not improve with treatment. ?Get help right away if: ?You have bleeding from the affected ear. ?You have severe ear pain. ?Summary ?Earwax can build up in the ear and cause discomfort or hearing loss. ?The most common symptoms of this condition include   reduced or muffled hearing, a feeling of fullness in the ear, or feeling that the ear is plugged. ?This condition may be diagnosed based on your symptoms, your medical history, and an ear exam. ?This condition may be treated by using ear drops to soften the earwax or by having the earwax removed by a health care provider. ?Do not put any objects, including cotton  swabs, into your ear. You can clean the opening of your ear canal with a washcloth or facial tissue. ?This information is not intended to replace advice given to you by your health care provider. Make sure you discuss any questions you have with your health care provider. ?Document Revised: 09/14/2019 Document Reviewed: 09/14/2019 ?Elsevier Patient Education ? 2022 Elsevier Inc. ? ?

## 2021-07-11 NOTE — Assessment & Plan Note (Signed)
Lab checked. °I will contact her with the result. °

## 2021-07-12 ENCOUNTER — Encounter: Payer: Self-pay | Admitting: Family Medicine

## 2021-07-12 ENCOUNTER — Telehealth: Payer: Self-pay | Admitting: Family Medicine

## 2021-07-12 LAB — VITAMIN D 25 HYDROXY (VIT D DEFICIENCY, FRACTURES): Vit D, 25-Hydroxy: 12.4 ng/mL — ABNORMAL LOW (ref 30.0–100.0)

## 2021-07-12 LAB — VITAMIN B12: Vitamin B-12: 270 pg/mL (ref 232–1245)

## 2021-07-12 MED ORDER — VITAMIN D (ERGOCALCIFEROL) 1.25 MG (50000 UNIT) PO CAPS: 50000.0000 [IU] | ORAL_CAPSULE | ORAL | 1 refills | Status: DC

## 2021-07-12 MED ORDER — VITAMIN B-12 500 MCG PO TABS: 500.0000 ug | ORAL_TABLET | Freq: Every day | ORAL | 1 refills | Status: DC

## 2021-07-12 NOTE — Telephone Encounter (Signed)
I was unable to leave a message on her mobile phone listed.  HIPAA compliant callback message left on her home phone.   Note: Advise her when she calls that her Vitamin B12 level is low normal. She may continue OTC oral supplementation.  Vitamin D is pretty low. I will send in weekly Vitamin D supplement. F/U in 8 weeks for recheck.

## 2021-07-16 ENCOUNTER — Encounter: Payer: Self-pay | Admitting: Family Medicine

## 2021-07-20 ENCOUNTER — Emergency Department (HOSPITAL_BASED_OUTPATIENT_CLINIC_OR_DEPARTMENT_OTHER)
Admission: EM | Admit: 2021-07-20 | Discharge: 2021-07-20 | Disposition: A | Discharge status: Home / Self Care | Payer: Medicaid Other | Attending: Emergency Medicine | Admitting: Emergency Medicine

## 2021-07-20 ENCOUNTER — Other Ambulatory Visit: Payer: Self-pay

## 2021-07-20 ENCOUNTER — Encounter (HOSPITAL_BASED_OUTPATIENT_CLINIC_OR_DEPARTMENT_OTHER): Payer: Self-pay | Admitting: Emergency Medicine

## 2021-07-20 ENCOUNTER — Emergency Department (HOSPITAL_BASED_OUTPATIENT_CLINIC_OR_DEPARTMENT_OTHER): Payer: Medicaid Other

## 2021-07-20 DIAGNOSIS — R079 Chest pain, unspecified: Secondary | ICD-10-CM | POA: Insufficient documentation

## 2021-07-20 DIAGNOSIS — R32 Unspecified urinary incontinence: Secondary | ICD-10-CM | POA: Diagnosis not present

## 2021-07-20 DIAGNOSIS — R0789 Other chest pain: Secondary | ICD-10-CM | POA: Diagnosis not present

## 2021-07-20 LAB — URINALYSIS, ROUTINE W REFLEX MICROSCOPIC
Bilirubin Urine: NEGATIVE
Glucose, UA: NEGATIVE mg/dL
Hgb urine dipstick: NEGATIVE
Ketones, ur: NEGATIVE mg/dL
Leukocytes,Ua: NEGATIVE
Nitrite: NEGATIVE
Protein, ur: NEGATIVE mg/dL
Specific Gravity, Urine: 1.008 (ref 1.005–1.030)
pH: 6 (ref 5.0–8.0)

## 2021-07-20 LAB — PREGNANCY, URINE: Preg Test, Ur: NEGATIVE

## 2021-07-20 NOTE — ED Notes (Signed)
Pt was able to ambulate self to bathroom to provide urine specimen. Urine specimen obtained and sent to lab.

## 2021-07-20 NOTE — ED Triage Notes (Signed)
Pt from home c/o intermittent  inability to hold urine in her bladder. Pt states that she will urinate on herself and empties most of the bladder when this occurs. Pt also reports having intermittent chest pain that started yesterday. Pt  report 3 - 4 episodes of chest pain today and 1 episode this morning each episode lasts less than a minute. Pt currently denis chest pain

## 2021-07-20 NOTE — ED Provider Notes (Signed)
[facility] Provider Note   CSN: [id number] Arrival date & time: 07/20/21  1800     History  Chief Complaint  Patient presents with   Chest Pain    Rose Butler is a 21 y.o. female.  Patient presents ER chief complaints of urinary urgency.  She states that she sometimes has to go so badly she loses control of her urine and goes in her pants.  She woke up last night and had urinated in bed which she has not done in the past.  She states has been drinking a lot of water recently.  Otherwise denies any pain with urination.  Denies fevers denies back pain or flank pain.  No reports of headache or abdominal pain.  Patient states that she also had some chest pain today described as sharp mid chest lasting 30 seconds at a time but intermittent throughout the day.  Currently the pain is not there but she had some discomfort about an hour prior.      Home Medications Prior to Admission medications   Medication Sig Start Date End Date Taking? Authorizing Provider  famotidine (PEPCID) 20 MG tablet TAKE 1 TABLET BY MOUTH EVERY DAY Patient not taking: Reported on 07/11/2021 10/02/20   Kinnie Feil, MD  vitamin B-12 (CYANOCOBALAMIN) 500 MCG tablet Take 1 tablet (500 mcg total) by mouth daily. 07/12/21   Kinnie Feil, MD  Vitamin D, Ergocalciferol, (DRISDOL) 1.25 MG (50000 UNIT) CAPS capsule Take 1 capsule (50,000 Units total) by mouth every 7 (seven) days. 07/12/21   Kinnie Feil, MD      Allergies    Patient has no known allergies.    Review of Systems   Review of Systems  Constitutional:  Negative for fever.  HENT:  Negative for ear pain.   Eyes:  Negative for pain.  Respiratory:  Negative for cough.   Cardiovascular:  Positive for chest pain.  Gastrointestinal:  Negative for abdominal pain.  Genitourinary:  Negative for flank pain.  Musculoskeletal:  Negative for back pain.  Skin:  Negative for rash.  Neurological:  Negative for headaches.    Physical Exam Updated Vital Signs BP 104/60    Pulse 84    Temp 98.3 F (36.8 C) (Oral)    Resp 18    Ht 5\' 6"  (1.676 m)    Wt 90.7 kg    LMP 06/24/2021 (Approximate)    SpO2 97%    BMI 32.28 kg/m  Physical Exam Constitutional:      General: She is not in acute distress.    Appearance: Normal appearance.  HENT:     Head: Normocephalic.     Nose: Nose normal.  Eyes:     Extraocular Movements: Extraocular movements intact.  Cardiovascular:     Rate and Rhythm: Normal rate.  Pulmonary:     Effort: Pulmonary effort is normal.  Musculoskeletal:        General: Normal range of motion.     Cervical back: Normal range of motion.     Comments: No C or T or L-spine midline step-offs or tenderness.  Neurological:     General: No focal deficit present.     Mental Status: She is alert. Mental status is at baseline.     Cranial Nerves: No cranial nerve deficit.     Motor: No weakness.     Gait: Gait normal.     Comments: Patient has no focal neurodeficit, has a normal gait without assistance.  ED Results / Procedures / Treatments   Labs (all labs ordered are listed, but only abnormal results are displayed) Labs Reviewed  URINALYSIS, ROUTINE W REFLEX MICROSCOPIC - Abnormal; Notable for the following components:      Result Value   Color, Urine COLORLESS (*)    All other components within normal limits  PREGNANCY, URINE    EKG EKG Interpretation  Date/Time:  Friday July 20 2021 19:06:21 EST Ventricular Rate:  83 PR Interval:  159 QRS Duration: 85 QT Interval:  357 QTC Calculation: 420 R Axis:   84 Text Interpretation: Sinus rhythm Right atrial enlargement Confirmed by Thamas Jaegers ([id number]) on 07/20/2021 7:29:40 PM  Radiology DG Chest Port 1 View  Result Date: 07/20/2021 CLINICAL DATA:  Chest pain EXAM: PORTABLE CHEST 1 VIEW COMPARISON:  None. FINDINGS: The heart size and mediastinal contours are within normal limits. Both lungs are clear. The visualized skeletal  structures are unremarkable. IMPRESSION: No active disease. Electronically Signed   By: Donavan Foil M.D.   On: 07/20/2021 19:18    Procedures Procedures    Medications Ordered in ED Medications - No data to display  ED Course/ Medical Decision Making/ A&P                           Medical Decision Making Amount and/or Complexity of Data Reviewed Labs: ordered. Radiology: ordered.   Urinalysis unremarkable pregnancy test is negative.  I doubt cauda equina she has no back pain and no focal neurodeficit no numbness or weakness.  EKG is unremarkable chest x-ray is clear as well.  Recommending outpatient follow-up with her primary care doctor this week as well as with urology within a week.  Advised immediate return for worsening symptoms numbness weakness pain or any additional concerns.        Final Clinical Impression(s) / ED Diagnoses Final diagnoses:  Urinary incontinence, unspecified type  Chest pain, unspecified type    Rx / DC Orders ED Discharge Orders     None         Luna Fuse, MD 07/20/21 [phone]

## 2021-07-20 NOTE — Discharge Instructions (Signed)
Call your primary care doctor or specialist as discussed in the next 2-3 days.   Return immediately back to the ER if:  Your symptoms worsen within the next 12-24 hours. You develop new symptoms such as new fevers, persistent vomiting, new pain, shortness of breath, or new weakness or numbness, or if you have any other concerns.  

## 2021-07-23 ENCOUNTER — Telehealth: Payer: Self-pay

## 2021-07-23 NOTE — Telephone Encounter (Signed)
Transition Care Management Unsuccessful Follow-up Telephone Call  Date of discharge and from where:  07/20/2021-[facility]  Attempts:  1st Attempt  Reason for unsuccessful TCM follow-up call:  Unable to leave message

## 2021-07-24 NOTE — Telephone Encounter (Signed)
Transition Care Management Unsuccessful Follow-up Telephone Call  Date of discharge and from where:  07/20/2021 from [facility]  Attempts:  2nd Attempt  Reason for unsuccessful TCM follow-up call:  Unable to leave message

## 2021-07-25 NOTE — Telephone Encounter (Signed)
Transition Care Management Unsuccessful Follow-up Telephone Call  Date of discharge and from where:  07/20/2021 - [facility]  Attempts:  3rd Attempt  Reason for unsuccessful TCM follow-up call:  Left voice message

## 2021-08-14 ENCOUNTER — Encounter: Payer: Medicaid Other | Admitting: Family Medicine

## 2021-08-15 ENCOUNTER — Other Ambulatory Visit: Payer: Self-pay

## 2021-08-15 ENCOUNTER — Encounter: Payer: Self-pay | Admitting: Family Medicine

## 2021-08-15 ENCOUNTER — Ambulatory Visit (INDEPENDENT_AMBULATORY_CARE_PROVIDER_SITE_OTHER): Payer: Medicaid Other | Admitting: Family Medicine

## 2021-08-15 VITALS — BP 110/65 | HR 103 | Ht 66.0 in | Wt 206.4 lb

## 2021-08-15 DIAGNOSIS — R41840 Attention and concentration deficit: Secondary | ICD-10-CM

## 2021-08-15 DIAGNOSIS — Z0000 Encounter for general adult medical examination without abnormal findings: Secondary | ICD-10-CM

## 2021-08-15 DIAGNOSIS — E669 Obesity, unspecified: Secondary | ICD-10-CM

## 2021-08-15 DIAGNOSIS — H6121 Impacted cerumen, right ear: Secondary | ICD-10-CM | POA: Diagnosis not present

## 2021-08-15 NOTE — Progress Notes (Signed)
? ? ?Subjective:  ?  ? Rose Butler is a 21 y.o. female and is here for a comprehensive physical exam. The patient reports problems - Weight concern. Walk 4-5 times a week x 1 1hr. She also watches her meal portion and dring mostly fruit smoothies (Mangos and Strawberries). ?She denies any SI or HI. However, she feels she has concentration issues and needs evaluation for ADHD. ?She starts work soon at [employer], also a Sport and exercise psychologist work. ?She is sexually active with a female partner and is not concerned about STDs or pregnancy. ?LMP:07/21/21 ? ?Social History  ? ?Socioeconomic History  ? Marital status: Single  ?  Spouse name: Not on file  ? Number of children: Not on file  ? Years of education: Not on file  ? Highest education level: Not on file  ?Occupational History  ? Occupation: [employer]  ?Tobacco Use  ? Smoking status: Never  ? Smokeless tobacco: Never  ?Vaping Use  ? Vaping Use: Never used  ?Substance and Sexual Activity  ? Alcohol use: No  ? Drug use: No  ? Sexual activity: Not Currently  ?Other Topics Concern  ? Not on file  ?Social History Narrative  ? Lives with mom, sister and niece  ? ?Social Determinants of Health  ? ?Financial Resource Strain: Not on file  ?Food Insecurity: Not on file  ?Transportation Needs: Not on file  ?Physical Activity: Not on file  ?Stress: Not on file  ?Social Connections: Not on file  ?Intimate Partner Violence: Not on file  ? ?Health Maintenance  ?Topic Date Due  ? COVID-19 Vaccine (1) 08/08/2022 (Originally [birth year]/2001)  ? INFLUENZA VACCINE  09/06/2022 (Originally 01/08/2021)  ? TETANUS/TDAP  02/16/2022  ? HPV VACCINES  Completed  ? Hepatitis C Screening  Completed  ? HIV Screening  Completed  ? ? ?The following portions of the patient's history were reviewed and updated as appropriate: allergies, current medications, past family history, past medical history, past social history, past surgical history, and problem list. ? ?Review of Systems ?Pertinent items  noted in HPI and remainder of comprehensive ROS otherwise negative.  ? ?Objective:  ? ? BP 110/65   Pulse (!) 103   Ht 5\' 6"  (1.676 m)   Wt 206 lb 6.4 oz (93.6 kg)   LMP 07/23/2021   SpO2 100%   BMI 33.31 kg/m?  ?General appearance: alert and cooperative ?Head: Normocephalic, without obvious abnormality, atraumatic ?Ears: Right cerumen impaction ?Throat: lips, mucosa, and tongue normal; teeth and gums normal ?Neck: no adenopathy, no carotid bruit, no JVD, supple, symmetrical, trachea midline, and thyroid not enlarged, symmetric, no tenderness/mass/nodules ?Lungs: clear to auscultation bilaterally ?Heart: regular rate and rhythm, S1, S2 normal, no murmur, click, rub or gallop ?Abdomen: soft, non-tender; bowel sounds normal; no masses,  no organomegaly ?Extremities: extremities normal, atraumatic, no cyanosis or edema ?Lymph nodes: Cervical, supraclavicular, and axillary nodes normal. ?Neurologic: Alert and oriented X 3, normal strength and tone. Normal symmetric reflexes. Normal coordination and gait  ?  ?Assessment:  ? ? Healthy female exam. ?Obese ?Attention issue ?Cerumen impaction ?  ?Plan:  ?Normal exam except for right ear canal cerumen impaction. ?Ear lavage completed during this visit with some improvement. ?May use deprox OTC prn. ?She declined flu and COVID19 shots today. ? ?For weight management - counseling provided. ?She is interested in referral to weight management clinic. ?Referral placed. ? ?Attention issue: ?Information provided regarding contacting [facility] for evaluation. ?Referral also placed to [facility] which ever  comes first. ?See After Visit Summary for Counseling Recommendations  ? ?

## 2021-08-15 NOTE — Assessment & Plan Note (Signed)
Counseling provided. ?She is interested in referral to weight management clinic. ?Referral placed. ?

## 2021-08-15 NOTE — Patient Instructions (Signed)
Located in: [facility] [address] ?Address: [address], [place], [place] [address] ?Hours:  ?Open ? Closes 3?PM ?Phone: [phone] ?

## 2021-11-06 ENCOUNTER — Encounter (INDEPENDENT_AMBULATORY_CARE_PROVIDER_SITE_OTHER): Payer: Self-pay

## 2021-11-13 ENCOUNTER — Encounter: Payer: Self-pay | Admitting: *Deleted

## 2021-12-07 ENCOUNTER — Emergency Department (HOSPITAL_COMMUNITY)
Admission: EM | Admit: 2021-12-07 | Discharge: 2021-12-07 | Disposition: A | Discharge status: Home / Self Care | Payer: PRIVATE HEALTH INSURANCE | Attending: Emergency Medicine | Admitting: Emergency Medicine

## 2021-12-07 ENCOUNTER — Emergency Department (HOSPITAL_COMMUNITY): Payer: PRIVATE HEALTH INSURANCE

## 2021-12-07 ENCOUNTER — Encounter (HOSPITAL_COMMUNITY): Payer: Self-pay

## 2021-12-07 DIAGNOSIS — Y92410 Unspecified street and highway as the place of occurrence of the external cause: Secondary | ICD-10-CM | POA: Diagnosis not present

## 2021-12-07 DIAGNOSIS — S60812A Abrasion of left wrist, initial encounter: Secondary | ICD-10-CM | POA: Insufficient documentation

## 2021-12-07 DIAGNOSIS — S6992XA Unspecified injury of left wrist, hand and finger(s), initial encounter: Secondary | ICD-10-CM | POA: Diagnosis present

## 2021-12-07 DIAGNOSIS — R079 Chest pain, unspecified: Secondary | ICD-10-CM | POA: Diagnosis not present

## 2021-12-07 DIAGNOSIS — M7918 Myalgia, other site: Secondary | ICD-10-CM

## 2021-12-07 MED ORDER — NAPROXEN 500 MG PO TABS: 500.0000 mg | ORAL_TABLET | Freq: Two times a day (BID) | ORAL | 0 refills | Status: DC

## 2021-12-07 MED ORDER — IBUPROFEN 200 MG PO TABS
600.0000 mg | ORAL_TABLET | Freq: Once | ORAL | Status: AC
Start: 2021-12-07 — End: 2021-12-07
  Administered 2021-12-07: 600 mg via ORAL
  Filled 2021-12-07: qty 3

## 2021-12-07 MED ORDER — METHOCARBAMOL 500 MG PO TABS: 1000.0000 mg | ORAL_TABLET | Freq: Four times a day (QID) | ORAL | 0 refills | Status: DC
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
## 2021-12-09 ENCOUNTER — Emergency Department (HOSPITAL_BASED_OUTPATIENT_CLINIC_OR_DEPARTMENT_OTHER)
Admission: EM | Admit: 2021-12-09 | Discharge: 2021-12-09 | Disposition: A | Discharge status: Home / Self Care | Payer: Medicaid Other | Attending: Emergency Medicine | Admitting: Emergency Medicine

## 2021-12-09 ENCOUNTER — Encounter (HOSPITAL_BASED_OUTPATIENT_CLINIC_OR_DEPARTMENT_OTHER): Payer: Self-pay | Admitting: Obstetrics and Gynecology

## 2021-12-09 ENCOUNTER — Emergency Department (HOSPITAL_BASED_OUTPATIENT_CLINIC_OR_DEPARTMENT_OTHER): Payer: Medicaid Other

## 2021-12-09 ENCOUNTER — Other Ambulatory Visit: Payer: Self-pay

## 2021-12-09 DIAGNOSIS — S060X1A Concussion with loss of consciousness of 30 minutes or less, initial encounter: Secondary | ICD-10-CM | POA: Insufficient documentation

## 2021-12-09 DIAGNOSIS — S0990XA Unspecified injury of head, initial encounter: Secondary | ICD-10-CM | POA: Diagnosis present

## 2021-12-09 DIAGNOSIS — S161XXA Strain of muscle, fascia and tendon at neck level, initial encounter: Secondary | ICD-10-CM | POA: Diagnosis not present

## 2021-12-09 DIAGNOSIS — M545 Low back pain, unspecified: Secondary | ICD-10-CM | POA: Insufficient documentation

## 2021-12-09 DIAGNOSIS — M62838 Other muscle spasm: Secondary | ICD-10-CM | POA: Insufficient documentation

## 2021-12-09 DIAGNOSIS — Y92410 Unspecified street and highway as the place of occurrence of the external cause: Secondary | ICD-10-CM | POA: Diagnosis not present

## 2021-12-09 MED ORDER — LIDOCAINE 5 % EX PTCH: 1.0000 | MEDICATED_PATCH | CUTANEOUS | 0 refills | Status: DC

## 2021-12-09 MED ORDER — ONDANSETRON 4 MG PO TBDP
4.0000 mg | ORAL_TABLET | Freq: Once | ORAL | Status: AC
  Administered 2021-12-09: 4 mg via ORAL
  Filled 2021-12-09: qty 1

## 2021-12-09 MED ORDER — HYDROCODONE-ACETAMINOPHEN 5-325 MG PO TABS
1.0000 | ORAL_TABLET | Freq: Once | ORAL | Status: AC
Start: 2021-12-09 — End: 2021-12-09
  Administered 2021-12-09: 1 via ORAL
  Filled 2021-12-09: qty 1
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
## 2021-12-09 NOTE — ED Notes (Signed)
Pt verbalizes understanding of discharge instructions. Opportunity for questioning and answers were provided. Pt discharged from ED to home with mother.   ? ?
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
## 2021-12-14 ENCOUNTER — Ambulatory Visit: Payer: Medicaid Other | Admitting: Family Medicine

## 2021-12-17 ENCOUNTER — Ambulatory Visit (INDEPENDENT_AMBULATORY_CARE_PROVIDER_SITE_OTHER): Payer: Medicaid Other | Admitting: Family Medicine

## 2021-12-17 VITALS — BP 120/80 | Ht 66.0 in | Wt 200.0 lb

## 2021-12-17 DIAGNOSIS — S060X0A Concussion without loss of consciousness, initial encounter: Secondary | ICD-10-CM | POA: Diagnosis not present

## 2021-12-17 DIAGNOSIS — S161XXA Strain of muscle, fascia and tendon at neck level, initial encounter: Secondary | ICD-10-CM | POA: Diagnosis not present
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
## 2021-12-17 NOTE — Patient Instructions (Signed)
Nice to meet you  Please try heat. Please try the exercises. I have made a referral to physical therapy. You can schedule appointment for shockwave therapy. Please send me a message in MyChart with any questions or updates.  Please see me back in 2-3 weeks.   --Dr. Jordan Likes
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
## 2021-12-18 ENCOUNTER — Ambulatory Visit: Payer: Medicaid Other | Admitting: Physical Therapy

## 2021-12-18 ENCOUNTER — Ambulatory Visit: Payer: Medicaid Other | Admitting: Family Medicine

## 2021-12-20 ENCOUNTER — Encounter (INDEPENDENT_AMBULATORY_CARE_PROVIDER_SITE_OTHER): Payer: Self-pay | Admitting: Bariatrics

## 2021-12-20 ENCOUNTER — Ambulatory Visit (INDEPENDENT_AMBULATORY_CARE_PROVIDER_SITE_OTHER): Payer: Medicaid Other | Admitting: Bariatrics

## 2021-12-20 VITALS — BP 108/76 | HR 80 | Temp 98.0°F | Ht 65.0 in | Wt 207.0 lb

## 2021-12-20 DIAGNOSIS — K219 Gastro-esophageal reflux disease without esophagitis: Secondary | ICD-10-CM | POA: Diagnosis not present

## 2021-12-20 DIAGNOSIS — F5089 Other specified eating disorder: Secondary | ICD-10-CM

## 2021-12-20 DIAGNOSIS — Z0000 Encounter for general adult medical examination without abnormal findings: Secondary | ICD-10-CM

## 2021-12-20 DIAGNOSIS — E559 Vitamin D deficiency, unspecified: Secondary | ICD-10-CM | POA: Diagnosis not present

## 2021-12-20 DIAGNOSIS — R5383 Other fatigue: Secondary | ICD-10-CM | POA: Diagnosis not present

## 2021-12-20 DIAGNOSIS — Z6834 Body mass index (BMI) 34.0-34.9, adult: Secondary | ICD-10-CM | POA: Diagnosis not present

## 2021-12-20 DIAGNOSIS — Z1331 Encounter for screening for depression: Secondary | ICD-10-CM | POA: Diagnosis not present

## 2021-12-20 DIAGNOSIS — R7309 Other abnormal glucose: Secondary | ICD-10-CM

## 2021-12-20 DIAGNOSIS — R0602 Shortness of breath: Secondary | ICD-10-CM

## 2021-12-20 DIAGNOSIS — E669 Obesity, unspecified: Secondary | ICD-10-CM

## 2021-12-20 DIAGNOSIS — E539 Vitamin B deficiency, unspecified: Secondary | ICD-10-CM | POA: Diagnosis not present

## 2021-12-21 LAB — LIPID PANEL WITH LDL/HDL RATIO
Cholesterol, Total: 145 mg/dL (ref 100–199)
HDL: 50 mg/dL (ref >39)
LDL Chol Calc (NIH): 75 mg/dL (ref 0–99)
LDL/HDL Ratio: 1.5 ratio (ref 0.0–3.2)
Triglycerides: 107 mg/dL (ref 0–149)
VLDL Cholesterol Cal: 20 mg/dL (ref 5–40)

## 2021-12-21 LAB — INSULIN, RANDOM: INSULIN: 11.3 u[IU]/mL (ref 2.6–24.9)

## 2021-12-21 LAB — COMPREHENSIVE METABOLIC PANEL
ALT: 30 IU/L (ref 0–32)
AST: 26 IU/L (ref 0–40)
Albumin/Globulin Ratio: 1.4 (ref 1.2–2.2)
Albumin: 4.2 g/dL (ref 4.0–5.0)
Alkaline Phosphatase: 62 IU/L (ref 42–106)
BUN/Creatinine Ratio: 13 (ref 9–23)
BUN: 10 mg/dL (ref 6–20)
Bilirubin Total: 0.4 mg/dL (ref 0.0–1.2)
CO2: 24 mmol/L (ref 20–29)
Calcium: 10.1 mg/dL (ref 8.7–10.2)
Chloride: 100 mmol/L (ref 96–106)
Creatinine, Ser: 0.77 mg/dL (ref 0.57–1.00)
Globulin, Total: 3.1 g/dL (ref 1.5–4.5)
Glucose: 84 mg/dL (ref 70–99)
Potassium: 4.2 mmol/L (ref 3.5–5.2)
Sodium: 138 mmol/L (ref 134–144)
Total Protein: 7.3 g/dL (ref 6.0–8.5)
eGFR: 113 mL/min/{1.73_m2} (ref >59)

## 2021-12-21 LAB — TSH+T4F+T3FREE
Free T4: 1.07 ng/dL (ref 0.82–1.77)
T3, Free: 3.5 pg/mL (ref 2.0–4.4)
TSH: 2.1 u[IU]/mL (ref 0.450–4.500)

## 2021-12-21 LAB — HEMOGLOBIN A1C
Est. average glucose Bld gHb Est-mCnc: 108 mg/dL
Hgb A1c MFr Bld: 5.4 % (ref 4.8–5.6)

## 2021-12-21 LAB — VITAMIN B12: Vitamin B-12: 434 pg/mL (ref 232–1245)

## 2021-12-21 LAB — VITAMIN D 25 HYDROXY (VIT D DEFICIENCY, FRACTURES): Vit D, 25-Hydroxy: 25 ng/mL — ABNORMAL LOW (ref 30.0–100.0)

## 2021-12-25 ENCOUNTER — Encounter (INDEPENDENT_AMBULATORY_CARE_PROVIDER_SITE_OTHER): Payer: Self-pay | Admitting: Bariatrics

## 2021-12-25 NOTE — Progress Notes (Signed)
Chief Complaint:   OBESITY Rose Butler (MR# [id number]) is a 21 y.o. female who presents for evaluation and treatment of obesity and related comorbidities. Current BMI is Body mass index is 34.45 kg/m. Rose Butler has been struggling with her weight for many years and has been unsuccessful in either losing weight, maintaining weight loss, or reaching her healthy weight goal.  Rose Butler likes to [employer] but she notes obstacles as having time to cook.  She states that she craves spicy foods, and she skips meals.  Rose Butler is currently in the action stage of change and ready to dedicate time achieving and maintaining a healthier weight. Rose Butler is interested in becoming our patient and working on intensive lifestyle modifications including (but not limited to) diet and exercise for weight loss.  Rose Butler's habits were reviewed today and are as follows: Her family eats meals together, she thinks her family will eat healthier with her, she struggles with family and or coworkers weight loss sabotage, her desired weight loss is 27-37 lbs, she has been heavy most of her life, her heaviest weight ever was 268 pounds, she is a picky eater and doesn't like to eat healthier foods, she has significant food cravings issues, she snacks frequently in the evenings, she skips meals frequently, she is frequently drinking liquids with calories, she has problems with excessive hunger, she frequently eats larger portions than normal, and she struggles with emotional eating.  Depression Screen Rose Butler's Food and Mood (modified PHQ-9) score was 12.     12/20/2021    7:12 AM  Depression screen PHQ 2/9  Decreased Interest 2  Down, Depressed, Hopeless 2  PHQ - 2 Score 4  Altered sleeping 0  Tired, decreased energy 2  Change in appetite 1  Feeling bad or failure about yourself  0  Trouble concentrating 2  Moving slowly or fidgety/restless 1  Suicidal thoughts 2  PHQ-9 Score 12  Difficult doing work/chores Not difficult  at all   Subjective:   1. Other fatigue Rose Butler admits to daytime somnolence and admits to waking up still tired. Patient has a history of symptoms of daytime fatigue. Rose Butler generally gets 7 hours of sleep per night, and states that she has generally restful sleep. Snoring is present. Apneic episodes are not present. Epworth Sleepiness Score is 6.   2. SOB (shortness of breath) on exertion Rose Butler notes increasing shortness of breath with exercising and seems to be worsening over time with weight gain. She notes getting out of breath sooner with activity than she used to. This has not gotten worse recently. Haden denies shortness of breath at rest or orthopnea.  3. Vitamin B deficiency Rose Butler was taking B12 vitamins.   4. Vitamin D deficiency Rose Butler is not taking Vitamin D.   5. Gastroesophageal reflux disease, unspecified whether esophagitis present Rose Butler is not on medications currently, and she notes decrease in symptoms or side effects.   6. Elevated glucose Rose Butler is not on medications currently.   7. Health care maintenance Rose Butler is due to have labs.   8. Other disorder of eating Rose Butler has not been officially diagnosed.   Assessment/Plan:   1. Other fatigue Carlia does feel that her weight is causing her energy to be lower than it should be. Fatigue may be related to obesity, depression or many other causes. Labs will be ordered, and in the meanwhile, Rose Butler will focus on self care including making healthy food choices, increasing physical activity and focusing on stress reduction.  -  Comprehensive metabolic panel - Hemoglobin A1c - Insulin, random - TSH+T4F+T3Free  2. SOB (shortness of breath) on exertion Rose Butler does feel that she gets out of breath more easily that she used to when she exercises. Kemyah's shortness of breath appears to be obesity related and exercise induced. She has agreed to work on weight loss and gradually increase exercise to treat her exercise  induced shortness of breath. Will continue to monitor closely.  3. Vitamin B deficiency The diagnosis was reviewed with the patient. We will check labs today. Orders and follow up as documented in patient record.  - Vitamin B12  4. Vitamin D deficiency We will check labs today. Rose Butler will follow-up for routine testing of Vitamin D, at least 2-3 times per year to avoid over-replacement.  - VITAMIN D 25 Hydroxy (Vit-D Deficiency, Fractures)  5. Gastroesophageal reflux disease, unspecified whether esophagitis present Rose Butler will avoid trigger foods: fats and oil.   6. Elevated glucose We will check labs today, and we will follow-up at Rose Butler's next visit.  - Hemoglobin A1c - Insulin, random - TSH+T4F+T3Free  7. Health care maintenance We will check labs today, and we will follow-up at Rose Butler's next visit.  - Comprehensive metabolic panel - Hemoglobin A1c - Insulin, random - TSH+T4F+T3Free - VITAMIN D 25 Hydroxy (Vit-D Deficiency, Fractures) - Lipid Panel With LDL/HDL Ratio - Vitamin B12  8. Other disorder of eating Rose Butler will continue to work on her eating habits.   9. Depression screening Rose Butler had a positive depression screening. Depression is commonly associated with obesity and often results in emotional eating behaviors. We will monitor this closely and work on CBT to help improve the non-hunger eating patterns. Referral to Psychology may be required if no improvement is seen as she continues in our clinic.  10. Class 1 obesity with serious comorbidity and body mass index (BMI) of 34.0 to 34.9 in adult, unspecified obesity type Rose Butler is currently in the action stage of change and her goal is to continue with weight loss efforts. I recommend Rose Butler begin the structured treatment plan as follows:  She has agreed to the Category 2 Plan.  Meal planning was discussed.  Reviewed labs with patient from 07/21/2021, vitamin D, B12.  Reviewed labs from 04/07/2020, B12, vitamin  D.  Exercise goals: No exercise has been prescribed at this time.   Behavioral modification strategies: increasing lean protein intake, decreasing simple carbohydrates, increasing vegetables, increasing water intake, decreasing eating out, no skipping meals, meal planning and cooking strategies, keeping healthy foods in the home, and planning for success.  She was informed of the importance of frequent follow-up visits to maximize her success with intensive lifestyle modifications for her multiple health conditions. She was informed we would discuss her lab results at her next visit unless there is a critical issue that needs to be addressed sooner. Rose Butler agreed to keep her next visit at the agreed upon time to discuss these results.  Objective:   Blood pressure 108/76, pulse 80, temperature 98 F (36.7 C), height 5\' 5"  (1.651 m), weight 207 lb (93.9 kg), last menstrual period 11/14/2021, SpO2 100 %. Body mass index is 34.45 kg/m.  EKG: Normal sinus rhythm, rate 99 BPM.  Indirect Calorimeter completed today shows a VO2 of 223 and a REE of 1541.  Her calculated basal metabolic rate is 01/14/2022 thus her basal metabolic rate is worse than expected.  General: Cooperative, alert, well developed, in no acute distress. HEENT: Conjunctivae and lids unremarkable. Cardiovascular: Regular rhythm.  Lungs: Normal work of breathing. Neurologic: No focal deficits.   Lab Results  Component Value Date   CREATININE 0.77 12/20/2021   BUN 10 12/20/2021   NA 138 12/20/2021   K 4.2 12/20/2021   CL 100 12/20/2021   CO2 24 12/20/2021   Lab Results  Component Value Date   ALT 30 12/20/2021   AST 26 12/20/2021   ALKPHOS 62 12/20/2021   BILITOT 0.4 12/20/2021   Lab Results  Component Value Date   HGBA1C 5.4 12/20/2021   Lab Results  Component Value Date   INSULIN 11.3 12/20/2021   Lab Results  Component Value Date   TSH 2.100 12/20/2021   Lab Results  Component Value Date   CHOL 145  12/20/2021   HDL 50 12/20/2021   LDLCALC 75 12/20/2021   TRIG 107 12/20/2021   Lab Results  Component Value Date   WBC 6.2 03/31/2020   HGB 12.3 03/31/2020   HCT 37.1 03/31/2020   MCV 91 03/31/2020   PLT 274 03/31/2020   No results found for: "IRON", "TIBC", "FERRITIN"  Attestation Statements:   Reviewed by clinician on day of visit: allergies, medications, problem list, medical history, surgical history, family history, social history, and previous encounter notes.   Rose Butler, am acting as Energy manager for [employer], DO.  I have reviewed the above documentation for accuracy and completeness, and I agree with the above. Rose Capra, DO

## 2021-12-28 ENCOUNTER — Telehealth: Payer: Self-pay | Admitting: Family Medicine

## 2021-12-28 ENCOUNTER — Encounter: Payer: Self-pay | Admitting: *Deleted

## 2021-12-28 NOTE — Telephone Encounter (Signed)
Patient called states was told to contact him if she experience Constant HA & or blurred vision--per pt she is noticing distance & close up blurriness  &  say has been suffering w/ chronic headaches when exposed to either bright lights or sunshine.  --Forwarding message to med asst to contact pt.  -glh
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
## 2021-12-31 ENCOUNTER — Other Ambulatory Visit: Payer: Self-pay | Admitting: *Deleted

## 2022-01-02 ENCOUNTER — Ambulatory Visit (INDEPENDENT_AMBULATORY_CARE_PROVIDER_SITE_OTHER): Payer: Medicaid Other | Admitting: Family Medicine

## 2022-01-02 ENCOUNTER — Encounter: Payer: Self-pay | Admitting: Family Medicine

## 2022-01-02 DIAGNOSIS — H539 Unspecified visual disturbance: Secondary | ICD-10-CM | POA: Diagnosis not present

## 2022-01-02 DIAGNOSIS — S060X0D Concussion without loss of consciousness, subsequent encounter: Secondary | ICD-10-CM | POA: Diagnosis not present

## 2022-01-02 NOTE — Patient Instructions (Addendum)
Good to see you today - Thank you for coming in  Things we discussed today:  Concussion - See an eye doctor for a general check up - Wean off the Robaxin Methacarbamol - take one a day for next 2 days then stop - for pain first take tylenol up to 2 tabs 4 times a day If not helping enough can take naproxen up to twice a day - go to Physical Therapy  I have put in a referral to [facility].  They should call you to schedule an appointment.  This could appear as an unknown number on your phone.  If you have not heard from them in 2 weeks please let me know.   If you have sudden change in vision or severe headache or any focal weakness you should go to the ER   Follow up with Dr Jordan Likes for follow up of the concussion
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
## 2022-01-03 ENCOUNTER — Ambulatory Visit (INDEPENDENT_AMBULATORY_CARE_PROVIDER_SITE_OTHER): Payer: Medicaid Other | Admitting: Bariatrics

## 2022-01-08 ENCOUNTER — Encounter: Payer: Self-pay | Admitting: Family Medicine

## 2022-01-08 ENCOUNTER — Ambulatory Visit (INDEPENDENT_AMBULATORY_CARE_PROVIDER_SITE_OTHER): Payer: PRIVATE HEALTH INSURANCE | Admitting: Family Medicine

## 2022-01-08 DIAGNOSIS — S060X0D Concussion without loss of consciousness, subsequent encounter: Secondary | ICD-10-CM | POA: Diagnosis not present

## 2022-01-08 DIAGNOSIS — S161XXD Strain of muscle, fascia and tendon at neck level, subsequent encounter: Secondary | ICD-10-CM | POA: Diagnosis not present
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
## 2022-01-08 NOTE — Patient Instructions (Signed)
Good to see you Please use heat  Please use sunglasses as needed Please continue physical therapy They have made a referral to [facility]   Please send me a message in MyChart with any questions or updates.  Please see me back before you go back to school.   --Dr. Jordan Likes
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
## 2022-01-08 NOTE — Assessment & Plan Note (Signed)
Having worsening headaches as well as photophobia. -Counseled on home exercise therapy and supportive care. -Continue physical therapy. -Has ophthalmology referral for her photophobia.

## 2022-01-10 ENCOUNTER — Encounter: Payer: Self-pay | Admitting: Family Medicine

## 2022-01-16 ENCOUNTER — Encounter (INDEPENDENT_AMBULATORY_CARE_PROVIDER_SITE_OTHER): Payer: Self-pay | Admitting: Bariatrics

## 2022-01-16 ENCOUNTER — Ambulatory Visit (INDEPENDENT_AMBULATORY_CARE_PROVIDER_SITE_OTHER): Payer: Medicaid Other | Admitting: Bariatrics

## 2022-01-16 ENCOUNTER — Encounter (INDEPENDENT_AMBULATORY_CARE_PROVIDER_SITE_OTHER): Payer: Self-pay

## 2022-01-16 VITALS — BP 108/70 | HR 84 | Temp 97.9°F | Ht 65.0 in | Wt 208.0 lb

## 2022-01-16 DIAGNOSIS — R632 Polyphagia: Secondary | ICD-10-CM

## 2022-01-16 DIAGNOSIS — E559 Vitamin D deficiency, unspecified: Secondary | ICD-10-CM | POA: Diagnosis not present

## 2022-01-16 DIAGNOSIS — Z6834 Body mass index (BMI) 34.0-34.9, adult: Secondary | ICD-10-CM

## 2022-01-16 DIAGNOSIS — E669 Obesity, unspecified: Secondary | ICD-10-CM

## 2022-01-16 DIAGNOSIS — E66811 Obesity, class 1: Secondary | ICD-10-CM

## 2022-01-16 MED ORDER — VITAMIN D (ERGOCALCIFEROL) 1.25 MG (50000 UNIT) PO CAPS: 50000.0000 [IU] | ORAL_CAPSULE | ORAL | 0 refills | Status: DC

## 2022-01-21 ENCOUNTER — Encounter: Payer: Self-pay | Admitting: Family Medicine

## 2022-01-21 ENCOUNTER — Ambulatory Visit (INDEPENDENT_AMBULATORY_CARE_PROVIDER_SITE_OTHER): Payer: PRIVATE HEALTH INSURANCE | Admitting: Family Medicine

## 2022-01-21 VITALS — BP 100/64 | Ht 65.0 in | Wt 208.0 lb

## 2022-01-21 DIAGNOSIS — S060X0D Concussion without loss of consciousness, subsequent encounter: Secondary | ICD-10-CM | POA: Diagnosis not present

## 2022-01-21 DIAGNOSIS — G44209 Tension-type headache, unspecified, not intractable: Secondary | ICD-10-CM | POA: Insufficient documentation

## 2022-01-21 MED ORDER — NORTRIPTYLINE HCL 10 MG PO CAPS: 10.0000 mg | ORAL_CAPSULE | Freq: Every day | ORAL | 3 refills | Status: DC

## 2022-01-21 NOTE — Patient Instructions (Signed)
Good to see you Please use heat on the neck  Please tell physical therapy about the balance and have them send me an order if needed  Please start the nortriptyline at night. Please send me a message in MyChart with any questions or updates.  Please see me back as needed if you are having the neurology appointment..   --Dr. Jordan Likes
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
## 2022-01-22 NOTE — Progress Notes (Unsigned)
Chief Complaint:   OBESITY Rose Butler is here to discuss her progress with her obesity treatment plan along with follow-up of her obesity related diagnoses. Rose Butler is on the Category 2 Plan and states she is following her eating plan approximately 35% of the time. Rose Butler states she is not currently exercising.  Today's visit was #: 2 Starting weight: 207 lbs Starting date: 12/20/21 Today's weight: 208 lbs Today's date: 01/16/22 Total lbs lost to date: 0 Total lbs lost since last in-office visit: +1  Interim History: She is up 1 pound since her first visit.  She will be able to prepare her meals better as she is back to college.  She is doing well with water intake.  Subjective:   1. Vitamin D insufficiency Vitamin D is 25.  2. Polyphagia Controlled.  Assessment/Plan:   1. Vitamin D insufficiency Refill - Vitamin D, Ergocalciferol, (DRISDOL) 1.25 MG (50000 UNIT) CAPS capsule; Take 1 capsule (50,000 Units total) by mouth every 7 (seven) days.  Dispense: 5 capsule; Refill: 0  2. Polyphagia 1.  We will try not to skip meals. 2.  Will add in a protein shake.  3. Obesity, Current BMI 34.6 1.  Will adhere to the plan 80-90%. 2.  Mindful eating 3.  Reviewed her labs 12/20/2021-CMP, lipids, vitamin D, B12, A1c, insulin, thyroid panel.  Rose Butler is currently in the action stage of change. As such, her goal is to continue with weight loss efforts. She has agreed to the Category 2 Plan.   Exercise goals: No exercise has been prescribed at this time.  Behavioral modification strategies: increasing lean protein intake, decreasing simple carbohydrates, increasing vegetables, increasing water intake, decreasing eating out, no skipping meals, meal planning and cooking strategies, keeping healthy foods in the home, and avoiding temptations.  Rose Butler has agreed to follow-up with our clinic in 2 weeks. She was informed of the importance of frequent follow-up visits to maximize her success with  intensive lifestyle modifications for her multiple health conditions.    Objective:   Blood pressure 108/70, pulse 84, temperature 97.9 F (36.6 C), height 5\' 5"  (1.651 m), weight 208 lb (94.3 kg), last menstrual period 12/13/2021, SpO2 100 %. Body mass index is 34.61 kg/m.  General: Cooperative, alert, well developed, in no acute distress. HEENT: Conjunctivae and lids unremarkable. Cardiovascular: Regular rhythm.  Lungs: Normal work of breathing. Neurologic: No focal deficits.   Lab Results  Component Value Date   CREATININE 0.77 12/20/2021   BUN 10 12/20/2021   NA 138 12/20/2021   K 4.2 12/20/2021   CL 100 12/20/2021   CO2 24 12/20/2021   Lab Results  Component Value Date   ALT 30 12/20/2021   AST 26 12/20/2021   ALKPHOS 62 12/20/2021   BILITOT 0.4 12/20/2021   Lab Results  Component Value Date   HGBA1C 5.4 12/20/2021   Lab Results  Component Value Date   INSULIN 11.3 12/20/2021   Lab Results  Component Value Date   TSH 2.100 12/20/2021   Lab Results  Component Value Date   CHOL 145 12/20/2021   HDL 50 12/20/2021   LDLCALC 75 12/20/2021   TRIG 107 12/20/2021   Lab Results  Component Value Date   VD25OH 25.0 (L) 12/20/2021   VD25OH 12.4 (L) 07/11/2021   VD25OH 29.1 (L) 08/08/2020   Lab Results  Component Value Date   WBC 6.2 03/31/2020   HGB 12.3 03/31/2020   HCT 37.1 03/31/2020   MCV 91 03/31/2020  PLT 274 03/31/2020   No results found for: "IRON", "TIBC", "FERRITIN"  Attestation Statements:   Reviewed by clinician on day of visit: allergies, medications, problem list, medical history, surgical history, family history, social history, and previous encounter notes.  I, Dawn Whitmire, FNP-C, am acting as transcriptionist for Dr. Corinna Capra.  I have reviewed the above documentation for accuracy and completeness, and I agree with the above. Corinna Capra, DO

## 2022-01-24 ENCOUNTER — Encounter (INDEPENDENT_AMBULATORY_CARE_PROVIDER_SITE_OTHER): Payer: Self-pay | Admitting: Bariatrics

## 2022-01-31 ENCOUNTER — Telehealth: Payer: Self-pay | Admitting: *Deleted
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
## 2022-02-07 ENCOUNTER — Other Ambulatory Visit (INDEPENDENT_AMBULATORY_CARE_PROVIDER_SITE_OTHER): Payer: Self-pay | Admitting: Bariatrics

## 2022-02-07 DIAGNOSIS — E559 Vitamin D deficiency, unspecified: Secondary | ICD-10-CM

## 2022-02-13 ENCOUNTER — Other Ambulatory Visit: Payer: Self-pay | Admitting: Family Medicine

## 2022-02-16 IMAGING — DX DG CHEST 1V PORT
1 series · 1 of 1 positions shown · non-contrast
Comparison: None.

CLINICAL DATA: Chest pain

EXAM:
PORTABLE CHEST 1 VIEW

[chest ap]
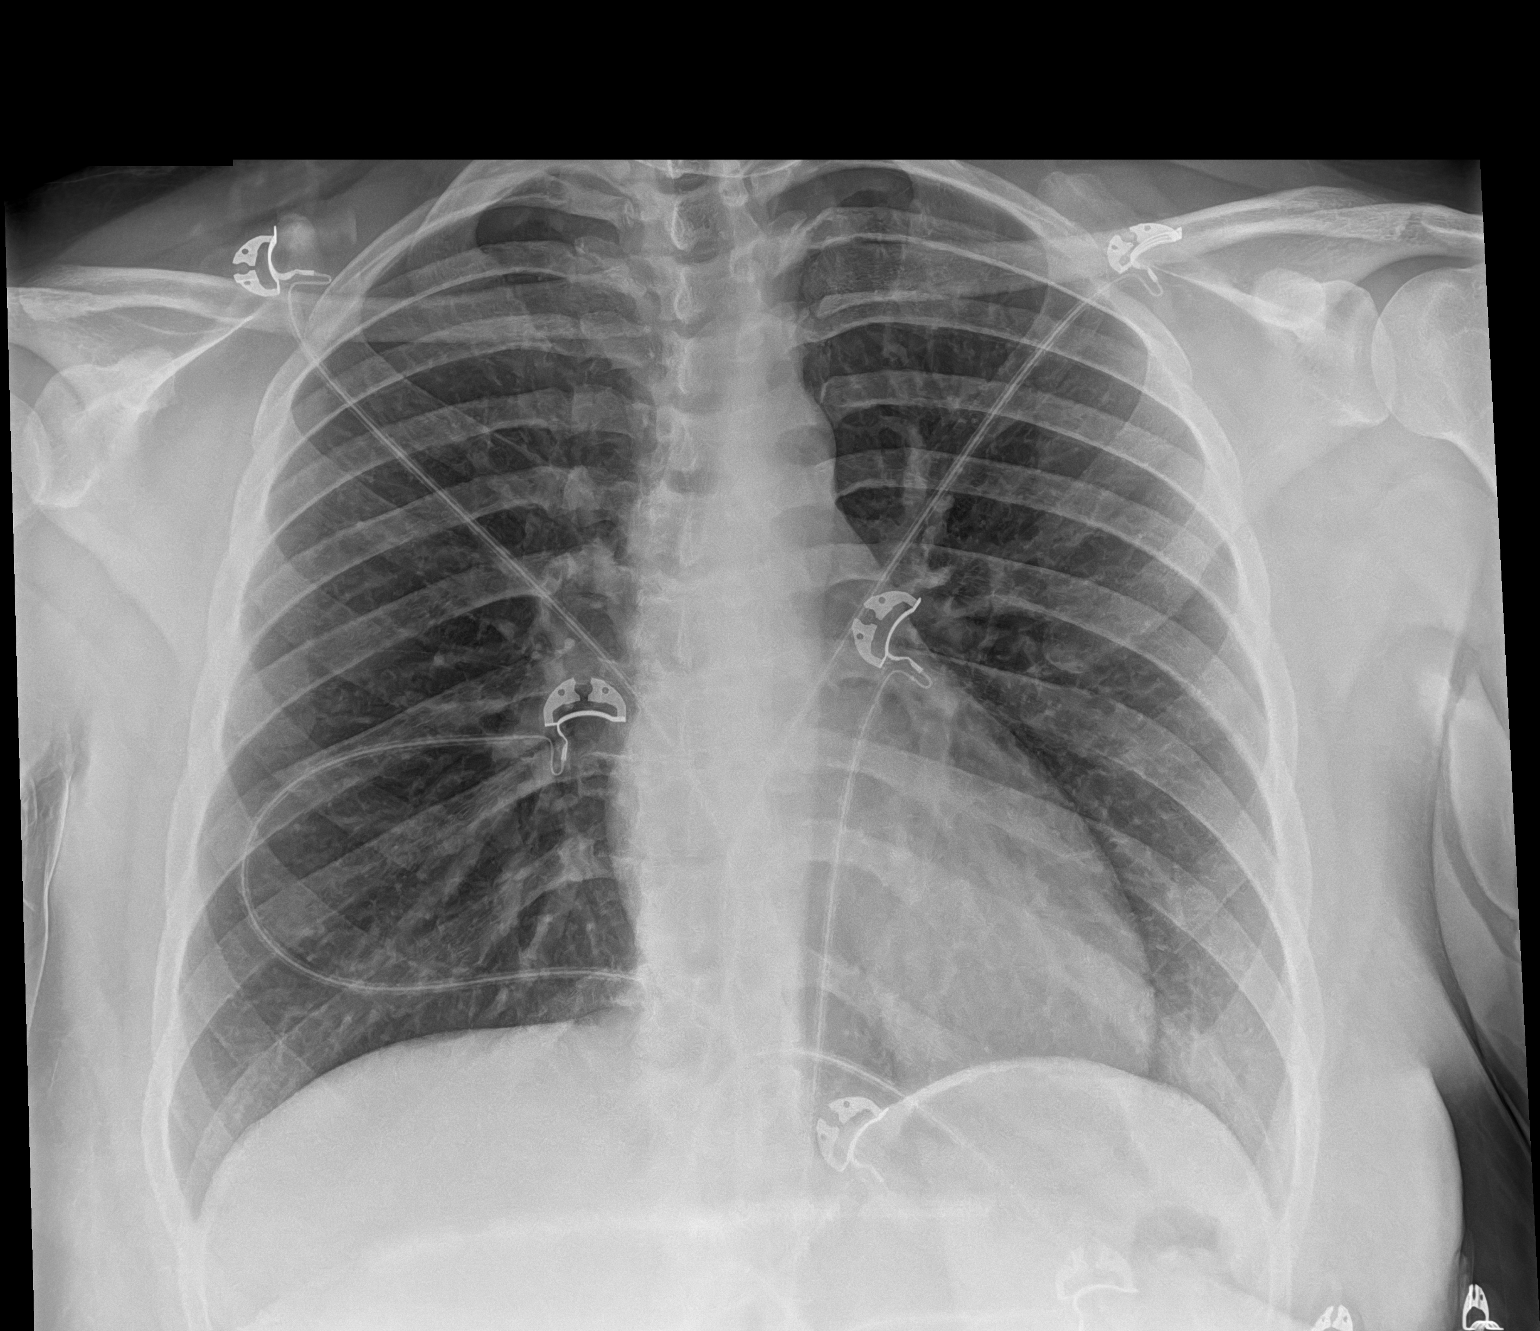

[1 of 1 positions shown; findings below may reference images not displayed]

FINDINGS: The heart size and mediastinal contours are within normal limits.
Both lungs are clear. The visualized skeletal structures are
unremarkable.
IMPRESSION: No active disease.

## 2022-02-22 ENCOUNTER — Encounter: Payer: Self-pay | Admitting: Family Medicine

## 2022-02-22 ENCOUNTER — Telehealth (INDEPENDENT_AMBULATORY_CARE_PROVIDER_SITE_OTHER): Payer: PRIVATE HEALTH INSURANCE | Admitting: Family Medicine

## 2022-02-22 VITALS — Ht 65.0 in | Wt 208.0 lb

## 2022-02-22 DIAGNOSIS — S060X0D Concussion without loss of consciousness, subsequent encounter: Secondary | ICD-10-CM

## 2022-02-22 DIAGNOSIS — H539 Unspecified visual disturbance: Secondary | ICD-10-CM | POA: Diagnosis not present
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
## 2022-06-12 ENCOUNTER — Encounter: Payer: Self-pay | Admitting: Family Medicine

## 2022-06-12 ENCOUNTER — Other Ambulatory Visit: Payer: Self-pay | Admitting: Family Medicine

## 2022-06-12 MED ORDER — BETAMETHASONE VALERATE 0.1 % EX CREA: TOPICAL_CREAM | Freq: Two times a day (BID) | CUTANEOUS | 0 refills | Status: DC

## 2022-08-13 DIAGNOSIS — R112 Nausea with vomiting, unspecified: Secondary | ICD-10-CM | POA: Diagnosis not present

## 2022-08-13 DIAGNOSIS — R1031 Right lower quadrant pain: Secondary | ICD-10-CM | POA: Diagnosis not present

## 2022-09-07 ENCOUNTER — Other Ambulatory Visit: Payer: Self-pay | Admitting: Family Medicine

## 2022-09-08 ENCOUNTER — Other Ambulatory Visit: Payer: Self-pay | Admitting: Family Medicine

## 2022-09-09 MED ORDER — BETAMETHASONE VALERATE 0.1 % EX CREA: TOPICAL_CREAM | Freq: Two times a day (BID) | CUTANEOUS | 0 refills | Status: DC | PRN

## 2022-09-23 ENCOUNTER — Encounter: Payer: Self-pay | Admitting: *Deleted

## 2022-10-22 ENCOUNTER — Ambulatory Visit: Payer: Medicaid Other | Admitting: Family Medicine

## 2022-10-28 ENCOUNTER — Other Ambulatory Visit: Payer: Self-pay | Admitting: Student

## 2022-10-28 ENCOUNTER — Ambulatory Visit (INDEPENDENT_AMBULATORY_CARE_PROVIDER_SITE_OTHER): Payer: Medicaid Other | Admitting: Student

## 2022-10-28 ENCOUNTER — Encounter: Payer: Self-pay | Admitting: Student

## 2022-10-28 VITALS — BP 110/64 | HR 99 | Ht 65.0 in | Wt 201.0 lb

## 2022-10-28 DIAGNOSIS — L309 Dermatitis, unspecified: Secondary | ICD-10-CM

## 2022-10-28 MED ORDER — FLUOCINONIDE EMULSIFIED BASE 0.05 % EX CREA: 1.0000 | TOPICAL_CREAM | Freq: Two times a day (BID) | CUTANEOUS | 0 refills | Status: DC

## 2022-10-28 MED ORDER — DOXYCYCLINE HYCLATE 100 MG PO TABS: 100.0000 mg | ORAL_TABLET | Freq: Two times a day (BID) | ORAL | 0 refills | Status: AC

## 2022-10-28 MED ORDER — FLUOCINONIDE 0.05 % EX OINT: 1.0000 | TOPICAL_OINTMENT | Freq: Two times a day (BID) | CUTANEOUS | 0 refills | Status: DC

## 2022-10-28 NOTE — Addendum Note (Signed)
Addended by: Glendale Chard on: 10/28/2022 03:10 PM   Modules accepted: Orders

## 2022-10-28 NOTE — Patient Instructions (Addendum)
I am prescribing a 7 days course of doxycycline.  This is an antibiotic you should take twice a day once in the morning and once in the evening for 7 days total.  For your eczema please do the following: In the morning and in the evening apply a thick layer of Lidex steroid cream followed by a thick layer of Vaseline. Overnight you can wrap your leg with Saran wrap to help yourself from not itching the areas.   At your next visit with Dr. Lum Babe that is currently scheduled for your Pap smear have her look at your leg to see how it is progressing.
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
## 2022-10-28 NOTE — Assessment & Plan Note (Addendum)
Severe eczema flare on leg seems to have superimposed bacterial infection with possible purulence.  Will prescribe a 7-day course of doxycycline.  For eczema management will prescribe Lidex steroid cream and instructed patient to apply generous amount of Vaseline over top steroid application twice a day.  Instructed patient to work on covering her worst lesions overnight to prevent her from scratching and introducing further infection.  Patient to follow-up with Dr. Lum Babe on 5/31.

## 2022-11-08 ENCOUNTER — Other Ambulatory Visit (HOSPITAL_COMMUNITY)
Admission: RE | Admit: 2022-11-08 | Discharge: 2022-11-08 | Disposition: A | Discharge status: Home / Self Care | Payer: Medicaid Other | Source: Ambulatory Visit | Attending: Family Medicine | Admitting: Family Medicine

## 2022-11-08 ENCOUNTER — Encounter: Payer: Self-pay | Admitting: Family Medicine

## 2022-11-08 ENCOUNTER — Ambulatory Visit (INDEPENDENT_AMBULATORY_CARE_PROVIDER_SITE_OTHER): Payer: Medicaid Other | Admitting: Family Medicine

## 2022-11-08 VITALS — BP 100/70 | HR 92 | Ht 65.0 in | Wt 208.2 lb

## 2022-11-08 DIAGNOSIS — Z113 Encounter for screening for infections with a predominantly sexual mode of transmission: Secondary | ICD-10-CM | POA: Diagnosis not present

## 2022-11-08 DIAGNOSIS — Z23 Encounter for immunization: Secondary | ICD-10-CM

## 2022-11-08 DIAGNOSIS — Z124 Encounter for screening for malignant neoplasm of cervix: Secondary | ICD-10-CM

## 2022-11-08 DIAGNOSIS — H539 Unspecified visual disturbance: Secondary | ICD-10-CM

## 2022-11-08 DIAGNOSIS — L309 Dermatitis, unspecified: Secondary | ICD-10-CM | POA: Diagnosis not present

## 2022-11-08 DIAGNOSIS — E559 Vitamin D deficiency, unspecified: Secondary | ICD-10-CM

## 2022-11-08 MED ORDER — FLUCONAZOLE 150 MG PO TABS: 150.0000 mg | ORAL_TABLET | Freq: Once | ORAL | 0 refills | Status: AC

## 2022-11-08 MED ORDER — VITAMIN D (ERGOCALCIFEROL) 1.25 MG (50000 UNIT) PO CAPS: 50000.0000 [IU] | ORAL_CAPSULE | ORAL | 1 refills | Status: DC

## 2022-11-08 NOTE — Assessment & Plan Note (Signed)
Improved with Lidex. Monitor closely on current regimen.
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
## 2022-11-08 NOTE — Assessment & Plan Note (Deleted)
Med refilled. She will f/u for lab work.

## 2022-11-08 NOTE — Patient Instructions (Signed)
Preventing Sexually Transmitted Infections, Adult Sexually transmitted infections (STIs) are spread from person to person (are contagious). They are spread, or transmitted, during sex. The sex may be vaginal, anal, or oral. STIs can be passed during sexual contact with skin, genitals, mouth, or rectum. They may spread through body fluids, such as saliva, semen, blood, vaginal mucus, and urine. STIs are very common. They can happen in people of all ages. Some common STIs are: Herpes. Hepatitis B. Chlamydia. Gonorrhea. Syphilis. Trichomoniasis. Human papillomavirus (HPV). Human immunodeficiency virus (HIV). This can cause acquired immunodeficiency syndrome (AIDS). How can STIs affect me? You may not have symptoms with an STI. Even if you do not have symptoms, you can still spread the infection to others. You also still need treatment. STIs can be treated. Some STIs can be cured. Other STIs cannot be cured and will affect you for the rest of your life. Certain STIs may: Require you to take medicine for the rest of your life. Affect your ability to have children. Increase your risk for getting other STIs. Increase your risk of getting certain conditions. These may include: Cervical cancer. Pelvic inflammatory disease (PID). Organ damage or damage to other parts of your body. This can happen if the infection spreads. Cause problems during pregnancy. STIs may be spread to the baby during pregnancy or birth. Females tend to have more severe problems from STIs than males. What can increase my risk? You may be more at risk for an STI if: You do not use protection during sex. You have more than one sex partner. You have a sex partner who has other sex partners. You have sex with a person who has an STI. You have an STI, or you have had an STI before. You inject drugs or have a sex partner who injects drugs. What actions can I take to prevent STIs? The only way to fully prevent STIs is not to  have sex of any kind. This is called practicing abstinence. If you are sexually active, you can protect yourself and others by taking these actions to lower your risk of getting an STI: Lifestyle Have only one sex partner or limit the number of sex partners you have. Avoid having sex after you have alcohol or drugs. Alcohol and drugs can affect your ability to make good choices. This can lead to risky sexual behaviors. Go to prevention counseling. This can teach you how to avoid getting an STI. Barrier protection  Use methods to stop body fluids from being exchanged between partners during sex (barrier protection). These methods can be used during oral, vaginal, or anal sex. They include: External condom, for males. Internal condom, for females. Dental dam. Use a new barrier method for every sex act from start to finish. Know that a barrier method may not protect you from all STIs. Some STIs, such as herpes, are spread through skin-to-skin contact. Avoid all sexual contact if you or a partner has herpes and there is an active flare with open sores. Birth control pills, injections, implants, and intrauterine devices (IUDs) do not protect against STIs. To prevent both STIs and pregnancy, always use a condom with a second form of birth control. General information Ask your health care provider about taking pre-exposure prophylaxis (PrEP) to prevent HIV. Stay up to date on your vaccines. Some vaccines can lower your risk of getting certain STIs. These include: Hepatitis B vaccine. HPV vaccine. This is recommended for people up to age 6. Get tested for STIs. Have your  partners get tested, too. If you test positive for an STI, follow recommendations from your health care provider about treatment. Make sure your sex partners are tested and treated as well. Where to find more information Learn more about STIs from: Centers for Disease Control and Prevention (CDC): More information about certain  STIs: TonerPromos.no Places to get sexual health counseling and treatment for free or at a low cost: gettested.TonerPromos.no U.S. Department of Health and Human Services [facility]): TravelLesson.ca This information is not intended to replace advice given to you by your health care provider. Make sure you discuss any questions you have with your health care provider. Document Revised: 06/06/2022 Document Reviewed: 11/09/2021 Elsevier Patient Education  2024 [employer].

## 2022-11-08 NOTE — Assessment & Plan Note (Signed)
Referred to ophthalmologist for further evaluation.

## 2022-11-08 NOTE — Assessment & Plan Note (Signed)
Med refilled. She will f/u for lab work. 

## 2022-11-11 ENCOUNTER — Telehealth: Payer: Self-pay | Admitting: Family Medicine

## 2022-11-11 LAB — CERVICOVAGINAL ANCILLARY ONLY
Bacterial Vaginitis (gardnerella): NEGATIVE
Candida Glabrata: NEGATIVE
Candida Vaginitis: POSITIVE — AB
Chlamydia: NEGATIVE
Comment: NEGATIVE
Comment: NEGATIVE
Comment: NEGATIVE
Comment: NEGATIVE
Comment: NEGATIVE
Comment: NORMAL
Neisseria Gonorrhea: NEGATIVE
Trichomonas: NEGATIVE

## 2022-11-11 NOTE — Telephone Encounter (Signed)
Patient confirmed her identity with two identifiers. STD screening negative discussed. + Yeast infection - already received treatment. No further questions. PAP test is pending.

## 2022-11-13 ENCOUNTER — Telehealth: Payer: Self-pay | Admitting: Family Medicine

## 2022-11-13 LAB — CYTOLOGY - PAP
Comment: NEGATIVE
High risk HPV: NEGATIVE

## 2022-11-13 NOTE — Telephone Encounter (Signed)
Patient confirmed her identity with two identifiers.  PAP test result discussed. Routine screening in 3 years recommended given negative HR-HPV. She agreed with the plan.      High risk HPV Negative  Adequacy Satisfactory for evaluation; transformation zone component PRESENT.  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

## 2022-11-25 DIAGNOSIS — H04123 Dry eye syndrome of bilateral lacrimal glands: Secondary | ICD-10-CM | POA: Diagnosis not present

## 2022-12-27 ENCOUNTER — Other Ambulatory Visit: Payer: Self-pay | Admitting: Student

## 2022-12-27 DIAGNOSIS — L309 Dermatitis, unspecified: Secondary | ICD-10-CM

## 2022-12-29 MED ORDER — FLUOCINONIDE 0.05 % EX OINT: 1.0000 | TOPICAL_OINTMENT | Freq: Two times a day (BID) | CUTANEOUS | 2 refills | Status: DC | PRN

## 2023-03-10 ENCOUNTER — Ambulatory Visit (INDEPENDENT_AMBULATORY_CARE_PROVIDER_SITE_OTHER): Payer: Medicaid Other | Admitting: Student

## 2023-03-10 ENCOUNTER — Encounter: Payer: Self-pay | Admitting: Student

## 2023-03-10 VITALS — BP 129/78 | HR 71 | Ht 66.0 in | Wt 199.0 lb

## 2023-03-10 DIAGNOSIS — M25512 Pain in left shoulder: Secondary | ICD-10-CM | POA: Diagnosis present

## 2023-03-10 MED ORDER — NAPROXEN 500 MG PO TABS
500.0000 mg | ORAL_TABLET | Freq: Two times a day (BID) | ORAL | 2 refills | Status: DC
Start: 2023-03-10 — End: 2023-03-18

## 2023-03-10 MED ORDER — DICLOFENAC SODIUM 1 % EX GEL
2.0000 g | Freq: Four times a day (QID) | CUTANEOUS | 1 refills | Status: DC
Start: 2023-03-10 — End: 2023-06-18
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
## 2023-03-10 NOTE — Patient Instructions (Addendum)
It was wonderful to meet you today. Thank you for allowing me to be a part of your care. Below is a short summary of what we discussed at your visit today:  Suspect your pain is most likely tendinitis from overuse injury or muscle strain.  Sent in prescription for naproxen which you will take 2 times daily for 14 days.  You can also use over-the-counter Voltaren gel on the affected area 2-4 times a day.  Lets see you in 1-2 weeks to reassess your pain.   If you have any questions or concerns, please do not hesitate to contact us via phone or MyChart message.   Jerre Simon, MD [facility]   Shoulder Exercises Ask your health care provider which exercises are safe for you. Do exercises exactly as told by your health care provider and adjust them as directed. It is normal to feel mild stretching, pulling, tightness, or discomfort as you do these exercises. Stop right away if you feel sudden pain or your pain gets worse. Do not begin these exercises until told by your health care provider. Stretching exercises External rotation and abduction This exercise is sometimes called corner stretch. The exercise rotates your arm outward (external rotation) and moves your arm out from your body (abduction). Stand in a doorway with one of your feet slightly in front of the other. This is called a staggered stance. If you cannot reach your forearms to the door frame, stand facing a corner of a room. Choose one of the following positions as told by your health care provider: Place your hands and forearms on the door frame above your head. Place your hands and forearms on the door frame at the height of your head. Place your hands on the door frame at the height of your elbows. Slowly move your weight onto your front foot until you feel a stretch across your chest and in the front of your shoulders. Keep your head and chest upright and keep your abdominal muscles tight. Hold for 20  seconds. To release the stretch, shift your weight to your back foot. Repeat 3 times. Complete this exercise 2-3 times a day. Extension, standing  Stand and hold a broomstick, a cane, or a similar object behind your back. Your hands should be a little wider than shoulder-width apart. Your palms should face away from your back. Keeping your elbows straight and your shoulder muscles relaxed, move the stick away from your body until you feel a stretch in your shoulders (extension). Avoid shrugging your shoulders while you move the stick. Keep your shoulder blades tucked down toward the middle of your back. Hold for 20 seconds. Slowly return to the starting position. Repeat 3 times. Complete this exercise 2-3 times a day. Range-of-motion exercises Pendulum  Stand near a wall or a surface that you can hold onto for balance. Bend at the waist and let your left / right arm hang straight down. Use your other arm to support you. Keep your back straight and do not lock your knees. Relax your left / right arm and shoulder muscles, and move your hips and your trunk so your left / right arm swings freely. Your arm should swing because of the motion of your body, not because you are using your arm or shoulder muscles. Keep moving your hips and trunk so your arm swings in the following directions, as told by your health care provider: Side to side. Forward and backward. In clockwise and counterclockwise circles.  Continue each motion for 20 seconds, or for as long as told by your health care provider. Slowly return to the starting position. Repeat 3 times. Complete this exercise 2-3  times a day. Wall push-ups  Stand so you are facing a stable wall. Your feet should be about one arm-length away from the wall. Lean forward and place your palms on the wall at shoulder height. Keep your feet flat on the floor as you bend your elbows and lean forward toward the wall. Hold for 15 seconds. Straighten your  elbows to push yourself back to the starting position. Repeat 3 times. Complete this exercise 2-3 times a day.  This information is not intended to replace advice given to you by your health care provider. Make sure you discuss any questions you have with your health care provider. Document Revised: 07/17/2021 Document Reviewed: 07/17/2021 Elsevier Patient Education  2024 [employer].

## 2023-03-17 ENCOUNTER — Ambulatory Visit: Payer: Medicaid Other | Admitting: Student

## 2023-03-17 NOTE — Progress Notes (Deleted)
    SUBJECTIVE:   CHIEF COMPLAINT / HPI:   Shoulder Pain Seen by Dr. Elliot Gurney on 9/30 and dx with overuse injury/tendinopathy. Still having symptoms.   PERTINENT  PMH / PSH: ***  OBJECTIVE:   There were no vitals taken for this visit.  ***  ASSESSMENT/PLAN:   No problem-specific Assessment & Plan notes found for this encounter.     Eliezer Mccoy, MD [facility]

## 2023-03-18 ENCOUNTER — Other Ambulatory Visit: Payer: Self-pay

## 2023-03-18 ENCOUNTER — Ambulatory Visit: Payer: Medicaid Other

## 2023-03-18 VITALS — BP 120/67 | HR 83 | Ht 66.0 in | Wt 203.4 lb

## 2023-03-18 DIAGNOSIS — T148XXA Other injury of unspecified body region, initial encounter: Secondary | ICD-10-CM | POA: Diagnosis present

## 2023-03-18 MED ORDER — MELOXICAM 15 MG PO TABS
15.0000 mg | ORAL_TABLET | Freq: Every day | ORAL | 0 refills | Status: DC
Start: 2023-03-18 — End: 2023-04-22

## 2023-03-18 NOTE — Patient Instructions (Addendum)
It was great to see you! Thank you for allowing me to participate in your care!   I recommend that you always bring your medications to each appointment as this makes it easy to ensure we are on the correct medications and helps [place] not miss when refills are needed.  Our plans for today:  - Take 15 mg of Meloxicam daily. Do NOT take naproxen, Advil, ibuprofen, Asprin while taking this medication - I have sent a referral to physical therapy - If you would like to schedule for OMT, please ask for an appointment with Dr. Claudean Severance in the next 2 weeks.   Take care and seek immediate care sooner if you develop any concerns. Please remember to show up 15 minutes before your scheduled appointment time!  Tiffany Kocher, DO [facility]
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
## 2023-04-05 ENCOUNTER — Ambulatory Visit: Payer: Medicaid Other | Attending: Family Medicine

## 2023-04-05 DIAGNOSIS — M6281 Muscle weakness (generalized): Secondary | ICD-10-CM | POA: Insufficient documentation

## 2023-04-05 DIAGNOSIS — G8929 Other chronic pain: Secondary | ICD-10-CM | POA: Insufficient documentation

## 2023-04-05 DIAGNOSIS — M542 Cervicalgia: Secondary | ICD-10-CM | POA: Insufficient documentation

## 2023-04-05 DIAGNOSIS — R293 Abnormal posture: Secondary | ICD-10-CM | POA: Insufficient documentation

## 2023-04-05 DIAGNOSIS — R2689 Other abnormalities of gait and mobility: Secondary | ICD-10-CM | POA: Insufficient documentation

## 2023-04-05 DIAGNOSIS — M25512 Pain in left shoulder: Secondary | ICD-10-CM | POA: Insufficient documentation

## 2023-04-09 ENCOUNTER — Ambulatory Visit: Payer: Medicaid Other | Admitting: Physical Therapy

## 2023-04-09 DIAGNOSIS — M6281 Muscle weakness (generalized): Secondary | ICD-10-CM

## 2023-04-09 DIAGNOSIS — R2689 Other abnormalities of gait and mobility: Secondary | ICD-10-CM | POA: Diagnosis present

## 2023-04-09 DIAGNOSIS — R293 Abnormal posture: Secondary | ICD-10-CM | POA: Diagnosis present

## 2023-04-09 DIAGNOSIS — G8929 Other chronic pain: Secondary | ICD-10-CM

## 2023-04-09 DIAGNOSIS — M542 Cervicalgia: Secondary | ICD-10-CM

## 2023-04-09 DIAGNOSIS — M25512 Pain in left shoulder: Secondary | ICD-10-CM | POA: Diagnosis present
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
## 2023-04-11 ENCOUNTER — Ambulatory Visit: Payer: Medicaid Other | Admitting: Family Medicine

## 2023-04-11 ENCOUNTER — Telehealth: Payer: Self-pay

## 2023-04-11 NOTE — Telephone Encounter (Signed)
At 8:58 am contacted the patient to see if she wanted to change her appointment to a telephone call, however the patient did not answer.

## 2023-04-16 ENCOUNTER — Ambulatory Visit: Payer: Medicaid Other | Admitting: Physical Therapy

## 2023-04-18 ENCOUNTER — Ambulatory Visit: Payer: Medicaid Other | Attending: Family Medicine | Admitting: Physical Therapy

## 2023-04-18 DIAGNOSIS — M25512 Pain in left shoulder: Secondary | ICD-10-CM | POA: Diagnosis present

## 2023-04-18 DIAGNOSIS — G8929 Other chronic pain: Secondary | ICD-10-CM | POA: Diagnosis present

## 2023-04-18 DIAGNOSIS — R2689 Other abnormalities of gait and mobility: Secondary | ICD-10-CM

## 2023-04-18 DIAGNOSIS — M542 Cervicalgia: Secondary | ICD-10-CM

## 2023-04-18 DIAGNOSIS — M6281 Muscle weakness (generalized): Secondary | ICD-10-CM
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
## 2023-04-22 ENCOUNTER — Ambulatory Visit: Payer: Medicaid Other | Admitting: Family Medicine

## 2023-04-22 ENCOUNTER — Ambulatory Visit (INDEPENDENT_AMBULATORY_CARE_PROVIDER_SITE_OTHER): Payer: Medicaid Other | Admitting: Student

## 2023-04-22 VITALS — BP 130/80 | HR 93 | Ht 66.0 in | Wt 204.4 lb

## 2023-04-22 DIAGNOSIS — M25512 Pain in left shoulder: Secondary | ICD-10-CM | POA: Diagnosis present

## 2023-04-22 NOTE — Assessment & Plan Note (Signed)
Given she has had some improvement with 2 PT sessions, and her physical exam appears unchanged from prior, I do suspect this is a continued muscle strain.  However, I suspect there is some scapular muscle involvement as well given she had significant tenderness along infraspinatus muscle belly.  We had lengthy discussion about indications for MRI, I do not feel this would benefit her currently.  No evidence of neurological involvement and has continued absence of weakness or loss of sensation.  Continue PT for 4 to 6 weeks total.  If this remains unchanged, would recommend proceeding to sports medicine for ultrasound.
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
## 2023-04-22 NOTE — Patient Instructions (Addendum)
It was great to see you today! Thank you for choosing [facility] for your primary care.  Today we addressed: I recommend continuing with physical therapy for 4 to 6 weeks.  If that does not continue to benefit you, we should consider sending you to sports medicine for an ultrasound.  I would not recommend an MRI at this time.  If you haven't already, sign up for My Chart to have easy access to your labs results, and communication with your primary care physician.  Return if symptoms worsen or fail to improve. Please arrive 15 minutes before your appointment to ensure smooth check in process.  We appreciate your efforts in making this happen.  Thank you for allowing me to participate in your care, Shelby Mattocks, DO 04/22/2023, 9:53 AM PGY-3, [facility]

## 2023-04-23 ENCOUNTER — Ambulatory Visit: Payer: Medicaid Other | Admitting: Physical Therapy

## 2023-04-25 ENCOUNTER — Ambulatory Visit: Payer: Medicaid Other | Admitting: Physical Therapy

## 2023-04-30 ENCOUNTER — Ambulatory Visit: Payer: Medicaid Other | Admitting: Physical Therapy

## 2023-05-02 ENCOUNTER — Ambulatory Visit: Payer: Medicaid Other | Admitting: Physical Therapy

## 2023-05-02 DIAGNOSIS — M6281 Muscle weakness (generalized): Secondary | ICD-10-CM

## 2023-05-02 DIAGNOSIS — G8929 Other chronic pain: Secondary | ICD-10-CM
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
## 2023-05-06 ENCOUNTER — Ambulatory Visit: Payer: Medicaid Other | Admitting: Physical Therapy

## 2023-05-07 ENCOUNTER — Ambulatory Visit: Payer: Medicaid Other

## 2023-05-07 ENCOUNTER — Encounter: Payer: Self-pay | Admitting: Family Medicine

## 2023-05-14 ENCOUNTER — Ambulatory Visit: Payer: Medicaid Other | Admitting: Physical Therapy

## 2023-05-16 ENCOUNTER — Ambulatory Visit: Payer: Medicaid Other | Admitting: Physical Therapy

## 2023-05-21 ENCOUNTER — Ambulatory Visit: Payer: Medicaid Other | Attending: Family Medicine | Admitting: Physical Therapy

## 2023-05-23 ENCOUNTER — Encounter: Payer: Self-pay | Admitting: Physical Therapy

## 2023-05-23 ENCOUNTER — Ambulatory Visit: Payer: Medicaid Other | Admitting: Physical Therapy

## 2023-05-23 NOTE — Therapy (Signed)
[facility] [address] [place], [place], [address] Phone: [phone]   Fax:  [phone]  Patient Details  Name: Rose Butler MRN: [id number] Date of Birth: Jan 11, 2001 Referring Provider:  No ref. provider found  Encounter Date: 05/23/2023  PHYSICAL THERAPY DISCHARGE SUMMARY  Visits from Start of Care: 3  Current functional level related to goals / functional outcomes: N/A   Remaining deficits: L shoulder/neck pain   Education / Equipment: HEP   Patient agrees to discharge. Patient goals were not met. Patient is being discharged due to not returning since the last visit.  Jill Alexanders Aideliz Garmany, PT, DPT 05/23/2023, 1:38 PM  [facility] [address] [place], [place], [address] Phone: [phone]   Fax:  [phone]

## 2023-06-06 ENCOUNTER — Ambulatory Visit (INDEPENDENT_AMBULATORY_CARE_PROVIDER_SITE_OTHER): Payer: Medicaid Other | Admitting: Family Medicine

## 2023-06-06 ENCOUNTER — Encounter: Payer: Self-pay | Admitting: Family Medicine

## 2023-06-06 VITALS — BP 121/80 | HR 70 | Wt 198.4 lb

## 2023-06-06 DIAGNOSIS — M25512 Pain in left shoulder: Secondary | ICD-10-CM | POA: Diagnosis present

## 2023-06-06 DIAGNOSIS — G8929 Other chronic pain: Secondary | ICD-10-CM | POA: Diagnosis not present

## 2023-06-06 MED ORDER — DULOXETINE HCL 30 MG PO CPEP
30.0000 mg | ORAL_CAPSULE | Freq: Every day | ORAL | 1 refills | Status: DC
Start: 2023-06-06 — End: 2023-06-18

## 2023-06-06 NOTE — Patient Instructions (Signed)
It was great to see you! Thank you for allowing me to participate in your care!  Our plans for today:  -I sent a new medication to your pharmacy to help with your pain called Cymbalta.  You will take this once daily. -I also placed a referral to sports medicine for an ultrasound as well as some physical maneuvers that may help with your pain.   Please arrive 15 minutes PRIOR to your next scheduled appointment time! If you do not, this affects OTHER patients' care.  Take care and seek immediate care sooner if you develop any concerns.   Celine Mans, MD, PGY-2 [facility] 11:33 AM 06/06/2023  [facility]
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
## 2023-06-13 ENCOUNTER — Other Ambulatory Visit: Payer: Self-pay | Admitting: Family Medicine

## 2023-06-13 ENCOUNTER — Encounter: Payer: Self-pay | Admitting: Family Medicine

## 2023-06-13 DIAGNOSIS — L309 Dermatitis, unspecified: Secondary | ICD-10-CM

## 2023-06-13 MED ORDER — FLUOCINONIDE 0.05 % EX OINT
1.0000 | TOPICAL_OINTMENT | Freq: Two times a day (BID) | CUTANEOUS | 2 refills | Status: DC | PRN
Start: 2023-06-13 — End: 2023-12-29
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
## 2023-06-18 ENCOUNTER — Ambulatory Visit: Payer: Medicaid Other | Admitting: Sports Medicine

## 2023-06-18 VITALS — HR 72 | Ht 66.0 in | Wt 202.0 lb

## 2023-06-18 DIAGNOSIS — G8929 Other chronic pain: Secondary | ICD-10-CM

## 2023-06-18 DIAGNOSIS — M25512 Pain in left shoulder: Secondary | ICD-10-CM | POA: Diagnosis not present

## 2023-06-18 DIAGNOSIS — M7582 Other shoulder lesions, left shoulder: Secondary | ICD-10-CM

## 2023-06-18 MED ORDER — MELOXICAM 15 MG PO TABS: 15.0000 mg | ORAL_TABLET | Freq: Every day | ORAL | 0 refills | Status: AC

## 2023-06-18 NOTE — Patient Instructions (Addendum)
 Shoulder HEP Do not use diclofenac , or Cymbalta   - Start meloxicam  15 mg daily x2 weeks.  If still having pain after 2 weeks, complete 3rd-week of NSAID. May use remaining NSAID as needed once daily for pain control.  Do not to use additional over-the-counter NSAIDs (ibuprofen , naproxen , Advil , Aleve ) while taking prescription NSAIDs.  May use Tylenol  [phone] mg 2 to 3 times a day for breakthrough pain. 4 week follow up
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
## 2023-07-16 ENCOUNTER — Ambulatory Visit: Payer: Medicaid Other | Admitting: Sports Medicine

## 2023-07-20 ENCOUNTER — Encounter: Payer: Self-pay | Admitting: Family Medicine

## 2023-07-21 ENCOUNTER — Other Ambulatory Visit: Payer: Self-pay | Admitting: Family Medicine

## 2023-07-21 DIAGNOSIS — L309 Dermatitis, unspecified: Secondary | ICD-10-CM
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
## 2023-07-24 ENCOUNTER — Ambulatory Visit (INDEPENDENT_AMBULATORY_CARE_PROVIDER_SITE_OTHER): Payer: No Typology Code available for payment source | Admitting: Sports Medicine

## 2023-07-24 VITALS — BP 120/68 | HR 86 | Ht 66.0 in | Wt 198.0 lb

## 2023-07-24 DIAGNOSIS — M25512 Pain in left shoulder: Secondary | ICD-10-CM

## 2023-07-24 DIAGNOSIS — G8929 Other chronic pain: Secondary | ICD-10-CM

## 2023-07-24 DIAGNOSIS — M7582 Other shoulder lesions, left shoulder: Secondary | ICD-10-CM | POA: Diagnosis not present

## 2023-07-24 NOTE — Patient Instructions (Signed)
Tylenol [phone] mg 2-3 times a day for pain relief  Stop daily meloxicam use remainder or other NSAID as needed no more than 1-2 times per week  PT referral  Follow up on one of your off days for an injection

## 2023-08-05 NOTE — Therapy (Incomplete)
OUTPATIENT PHYSICAL THERAPY UPPER EXTREMITY EVALUATION   Patient Name: Rose Butler MRN: [id number] DOB:[birth year], 23 y.o., female Today's Date: 08/05/2023  END OF SESSION:   Past Medical History:  Diagnosis Date   B12 deficiency    Back pain    Eczema    Eczema    Elevated blood pressure 02/17/2012   Joint pain    Left upper extremity numbness 09/03/2019   Patellar subluxation 08/07/2016   Seasonal allergies 10/08/2017   Vitamin D deficiency    Past Surgical History:  Procedure Laterality Date   KNEE ARTHROSCOPY WITH MEDIAL PATELLAR FEMORAL LIGAMENT RECONSTRUCTION Left 05/21/2018   Procedure: LEFT ARTHROSCOPY KNEE MEDIAL  PATELLA FEMORAL LIGAMENT RECONSTRUCTION, SEMI T ALLOGRAFT, REMOVAL OF LOOSE BODY;  Surgeon: Bjorn Pippin, MD;  Location: [facility];  Service: Orthopedics;  Laterality: Left;   TONSILLECTOMY  2010   Patient Active Problem List   Diagnosis Date Noted   Left shoulder pain 04/22/2023   Vision changes 01/02/2022   Vitamin D deficiency 04/08/2020   Vitamin B deficiency 04/08/2020   GERD (gastroesophageal reflux disease) 04/07/2020   Macromastia 11/12/2016   Eczema    Obesity 02/17/2012   Allergic rhinitis 08/07/2006    PCP: Doreene Eland, MD  REFERRING PROVIDER: Richardean Sale, DO  REFERRING DIAG:  [phone] (ICD-10-CM) - Chronic left shoulder pain  M75.82 (ICD-10-CM) - Tendinitis of left rotator cuff    THERAPY DIAG:  No diagnosis found.  Rationale for Evaluation and Treatment: Rehabilitation  ONSET DATE: ***  SUBJECTIVE:                                                                                                                                                                                      SUBJECTIVE STATEMENT: Eval statement 08/05/2023: *** Hand dominance: {MISC; OT HAND DOMINANCE:[phone]}  PERTINENT HISTORY: ***  PAIN:  Are you having pain? {OPRCPAIN:27236}  PRECAUTIONS: {Therapy  precautions:24002}  RED FLAGS: {PT Red Flags:29287}   WEIGHT BEARING RESTRICTIONS: {Yes ***/No:24003}  FALLS:  Has patient fallen in last 6 months? {fallsyesno:27318}  LIVING ENVIRONMENT: Lives with: {[facility] lives with:25569::"lives with their family"} Lives in: {Lives in:25570} Stairs: {opstairs:27293} Has following equipment at home: {Assistive devices:23999}  OCCUPATION: ***  PLOF: {PLOF:24004}  PATIENT GOALS: ***  NEXT MD VISIT: ***  OBJECTIVE:  Note: Objective measures were completed at Evaluation unless otherwise noted.  DIAGNOSTIC FINDINGS:  ***  PATIENT SURVEYS :  {rehab surveys:24030:a}  COGNITION: Overall cognitive status: {cognition:24006}     SENSATION: {sensation:27233}  POSTURE: ***  UPPER EXTREMITY ROM:   {AROM/PROM:27142} ROM Right eval Left eval  Shoulder flexion    Shoulder extension    Shoulder  abduction    Shoulder adduction    Shoulder internal rotation    Shoulder external rotation    Elbow flexion    Elbow extension    Wrist flexion    Wrist extension    Wrist ulnar deviation    Wrist radial deviation    Wrist pronation    Wrist supination    (Blank rows = not tested)  UPPER EXTREMITY MMT:  MMT Right eval Left eval  Shoulder flexion    Shoulder extension    Shoulder abduction    Shoulder adduction    Shoulder internal rotation    Shoulder external rotation    Middle trapezius    Lower trapezius    Elbow flexion    Elbow extension    Wrist flexion    Wrist extension    Wrist ulnar deviation    Wrist radial deviation    Wrist pronation    Wrist supination    Grip strength (lbs)    (Blank rows = not tested)  SHOULDER SPECIAL TESTS: Impingement tests: {shoulder impingement test:25231:a} SLAP lesions: {SLAP lesions:25232} Instability tests: {shoulder instability test:25233} Rotator cuff assessment: {rotator cuff assessment:25234} Biceps assessment: {biceps assessment:25235}  JOINT MOBILITY TESTING:   ***  PALPATION:  ***                                             [facility] Adult PT Treatment:                                                DATE: 08/05/2023  Therapeutic Exercise: *** Manual Therapy: *** Neuromuscular re-ed: *** Therapeutic Activity: *** Modalities: *** Self Care: ***   PATIENT EDUCATION: Education details: Pt received education regarding HEP performance, ADL performance, functional activity tolerance, impairment education, appropriate performance of therapeutic activities. Person educated: {Person educated:25204} Education method: {Education Method:25205} Education comprehension: {Education Comprehension:25206}  HOME EXERCISE PROGRAM: ***  ASSESSMENT:  CLINICAL IMPRESSION: Eval impression (08/05/2023): Pt. attended today's physical therapy session for evaluation of ***. Pt has complaints of ***. Pt has notable deficits with ***.  Signs and symptoms are concurrent with ***. Pt would benefit from therapeutic focus on ***.  Treatment performed today focused on *** Pt demonstrated *** understanding of education provided. required *** cues and *** assistance for appropriate performance with today's activities.  Pt requires the intervention of skilled outpatient physical therapy to address the aforementioned deficits and progress towards a functional level in line with therapeutic goals.    OBJECTIVE IMPAIRMENTS: {opptimpairments:25111}.   ACTIVITY LIMITATIONS: {activitylimitations:27494}  PARTICIPATION LIMITATIONS: {participationrestrictions:25113}  PERSONAL FACTORS: {Personal factors:25162} are also affecting patient's functional outcome.   REHAB POTENTIAL: {rehabpotential:25112}  CLINICAL DECISION MAKING: {clinical decision making:25114}  EVALUATION COMPLEXITY: {Evaluation complexity:25115}  GOALS: Goals reviewed with patient? Yes  SHORT TERM GOALS: Target date: ***  Pt will be independent with administered HEP to demonstrate the competency  necessary for long term managemnet of symptoms at home.  Baseline: Goal status: {GOALSTATUS:25110}  2.  *** Baseline:  Goal status: {GOALSTATUS:25110}  3.  *** Baseline:  Goal status: {GOALSTATUS:25110}  4.  *** Baseline:  Goal status: {GOALSTATUS:25110}  5.  *** Baseline:  Goal status: {GOALSTATUS:25110}  6.  *** Baseline:  Goal status: {GOALSTATUS:25110}  LONG TERM GOALS: Target  date: ***  Pt. Will achieve a DASH score of *** as to demonstrate improvement in self-perceived functional ability with daily activities.  Baseline:  Goal status: {GOALSTATUS:25110}  2.  Pt will report pain levels improving during ADLs to be less than or equal to ***/10 as to demonstrate improved tolerance with daily functional activities such as ***.  Baseline:  Goal status: {GOALSTATUS:25110}  3.  Pt will improve MMT score for *** to a ***/5 to demonstrate improvement in strength for quality of motion and activity performance.  Baseline:  Goal status: {GOALSTATUS:25110}  4.  *** Baseline:  Goal status: {GOALSTATUS:25110}  5.  *** Baseline:  Goal status: {GOALSTATUS:25110}  6.  *** Baseline:  Goal status: {GOALSTATUS:25110}  PLAN: PT FREQUENCY: {rehab frequency:25116}  PT DURATION: {rehab duration:25117}  PLANNED INTERVENTIONS: 97110-Therapeutic exercises, 97530- Therapeutic activity, 97112- Neuromuscular re-education, 97535- Self Care, [id number]- Manual therapy, [phone]- Electrical stimulation (manual), Patient/Family education, Taping, Dry Needling, Joint mobilization, Cryotherapy, and Moist heat  PLAN FOR NEXT SESSION: Review HEP, Begin POC as detailed in assessment   Sheliah Plane, PT, DPT 08/05/2023, 3:47 PM

## 2023-08-07 ENCOUNTER — Ambulatory Visit: Payer: No Typology Code available for payment source | Admitting: Physical Therapy

## 2023-08-24 ENCOUNTER — Other Ambulatory Visit: Payer: Self-pay

## 2023-08-24 ENCOUNTER — Emergency Department (HOSPITAL_BASED_OUTPATIENT_CLINIC_OR_DEPARTMENT_OTHER)
Admission: EM | Admit: 2023-08-24 | Discharge: 2023-08-24 | Disposition: A | Discharge status: Home / Self Care | Attending: Emergency Medicine | Admitting: Emergency Medicine

## 2023-08-24 ENCOUNTER — Encounter (HOSPITAL_BASED_OUTPATIENT_CLINIC_OR_DEPARTMENT_OTHER): Payer: Self-pay | Admitting: Emergency Medicine

## 2023-08-24 DIAGNOSIS — J019 Acute sinusitis, unspecified: Secondary | ICD-10-CM | POA: Diagnosis not present

## 2023-08-24 DIAGNOSIS — Z20822 Contact with and (suspected) exposure to covid-19: Secondary | ICD-10-CM | POA: Insufficient documentation

## 2023-08-24 DIAGNOSIS — R0981 Nasal congestion: Secondary | ICD-10-CM | POA: Diagnosis present

## 2023-08-24 LAB — RESP PANEL BY RT-PCR (RSV, FLU A&B, COVID)  RVPGX2
Influenza A by PCR: NEGATIVE
Influenza B by PCR: NEGATIVE
Resp Syncytial Virus by PCR: NEGATIVE
SARS Coronavirus 2 by RT PCR: NEGATIVE

## 2023-08-24 MED ORDER — ONDANSETRON 4 MG PO TBDP
4.0000 mg | ORAL_TABLET | ORAL | 0 refills | Status: AC | PRN
Start: 2023-08-24 — End: ?

## 2023-08-24 MED ORDER — AMOXICILLIN-POT CLAVULANATE 875-125 MG PO TABS: 1.0000 | ORAL_TABLET | Freq: Two times a day (BID) | ORAL | 0 refills | Status: AC

## 2023-08-24 MED ORDER — IBUPROFEN 800 MG PO TABS: 800.0000 mg | ORAL_TABLET | Freq: Three times a day (TID) | ORAL | 0 refills | Status: AC

## 2023-08-24 NOTE — Discharge Instructions (Signed)
 1.  Start taking Augmentin twice daily for a week. 2.  You may take ibuprofen 800 mg every 8 hours for pain.  Take Zofran every 4 hours if needed for nausea. 3.  You are being treated for a bacterial sinus infection based on your symptoms of facial pain and reported fever.  If your sinus infection is due to bacterial infection, antibiotics should be helpful and you should start to see some improvement in 24 to 48 hours.  However, sinus infections may also be caused by viruses and will not improve with antibiotics.  Return to the emergency department if your symptoms are worsening or you develop new concerning symptoms. 4.  Try to schedule follow-up with a family doctor in the next 7 to 10 days.

## 2023-08-24 NOTE — ED Notes (Signed)
 RN reviewed discharge instructions with pt. Pt verbalized understanding and had no further questions. VSS upon discharge.

## 2023-08-24 NOTE — ED Triage Notes (Signed)
 C/o nasal congestion and headache x 3 days. States multiple people sick at work. Denies fevers.

## 2023-08-24 NOTE — ED Provider Notes (Signed)
 [place] EMERGENCY DEPARTMENT AT [facility] Provider Note   CSN: [id number] Arrival date & time: 08/24/23  1326     History  Chief Complaint  Patient presents with   Nasal Congestion    Rose Butler is a 23 y.o. female.  HPI Patient reports she has had 3 days of headache and facial pressure.  She reports she has a lot of pain at her temples and behind her forehead and nose.  She reports it feels like there is a lot of congestion present.  She reports she has had a fever.  She feels very hot but does not have a thermometer to check her temperature.  Patient reports she is also felt somewhat nauseated.  Just recently had an episode of vomiting.  She denies earache or sore throat.  Patient reports she has felt somewhat dizzy and off balance.  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  She thinks this has exacerbated her symptoms.  Patient reports she felt a little short of breath but has not had any cough or chest pain.    Home Medications Prior to Admission medications   Medication Sig Start Date End Date Taking? Authorizing Provider  amoxicillin-clavulanate (AUGMENTIN) 875-125 MG tablet Take 1 tablet by mouth every 12 (twelve) hours. 08/24/23  Yes Arby Barrette, MD  ibuprofen (ADVIL) 800 MG tablet Take 1 tablet (800 mg total) by mouth 3 (three) times daily. 08/24/23  Yes Arby Barrette, MD  ondansetron (ZOFRAN-ODT) 4 MG disintegrating tablet Take 1 tablet (4 mg total) by mouth every 4 (four) hours as needed for nausea or vomiting. 08/24/23  Yes Arby Barrette, MD  fluocinonide ointment (LIDEX) 0.05 % Apply 1 Application topically 2 (two) times daily as needed. 06/13/23   Doreene Eland, MD  meloxicam (MOBIC) 15 MG tablet Take 1 tablet (15 mg total) by mouth daily. 06/18/23   Richardean Sale, DO       Allergies    Patient has no known allergies.    Review of Systems   Review of Systems  Physical Exam Updated Vital Signs BP 137/82 (BP Location: Left Arm)   Pulse 75   Temp 98.1 F (36.7 C) (Temporal)   Resp 18   Ht 5\' 6"  (1.676 m)   Wt 89 kg   SpO2 100%   BMI 31.67 kg/m  Physical Exam Constitutional:      Comments: Alert nontoxic no respiratory distress.  Mental status clear.  HENT:     Head:     Comments: No visible facial swelling or erythema.  Patient does endorse tenderness to palpation to percussion over the frontal sinuses in the maxillary sinuses.    Right Ear: Tympanic membrane normal.     Left Ear: Tympanic membrane normal.     Nose:     Comments: Some bogginess of the turbinates.  No blood.    Mouth/Throat:     Mouth: Mucous membranes are moist.     Pharynx: Oropharynx is clear.  Eyes:     Extraocular Movements: Extraocular movements intact.     Conjunctiva/sclera: Conjunctivae normal.     Pupils: Pupils are equal, round, and reactive to light.  Cardiovascular:     Rate and Rhythm: Normal rate and regular rhythm.  Pulmonary:     Effort: Pulmonary effort  is normal.     Breath sounds: Normal breath sounds.  Abdominal:     General: There is no distension.     Palpations: Abdomen is soft.  Musculoskeletal:        General: Normal range of motion.     Cervical back: Neck supple. No rigidity.  Lymphadenopathy:     Cervical: No cervical adenopathy.  Skin:    General: Skin is warm and dry.  Neurological:     General: No focal deficit present.     Mental Status: She is oriented to person, place, and time.     Motor: No weakness.     Coordination: Coordination normal.  Psychiatric:        Mood and Affect: Mood normal.     ED Results / Procedures / Treatments   Labs (all labs ordered are listed, but only abnormal results are displayed) Labs Reviewed  RESP PANEL BY RT-PCR (RSV, FLU A&B, COVID)  RVPGX2    EKG None  Radiology No results  found.  Procedures Procedures    Medications Ordered in ED Medications - No data to display  ED Course/ Medical Decision Making/ A&P                                 Medical Decision Making  This is outlined with subjective fevers, headache, nasal congestion and facial pain.  Patient reports multiple sick contacts.  Physical exam is positive for facial tenderness to percussion over the sinuses.  No meningismus.  Clear mental status.  Was respiratory panel test negative.  At this time with combination of facial pain plus subjective fever will opt to treat for potential bacterial sinusitis.  Patient is counseled on bacterial versus viral sinusitis and signs and symptoms for which to return if worsening.  At this time I have low suspicion for meningitis.  Patient has no meningismus and headache is frontal in the distribution of sinuses and reproducible.  Possible viral infection but given reported fevers and reproducible facial pain will opt to treat bacterial.  His vital signs are normal.  No hypotension, no tachycardia, no fever, no other clinical symptoms of respiratory infection.  At this time does not suggest sepsis.  Do not feel that additional lab work is indicated currently.  Patient does not have significant comorbid medical conditions.        Final Clinical Impression(s) / ED Diagnoses Final diagnoses:  Acute sinusitis, recurrence not specified, unspecified location    Rx / DC Orders ED Discharge Orders          Ordered    amoxicillin-clavulanate (AUGMENTIN) 875-125 MG tablet  Every 12 hours        08/24/23 1554    ibuprofen (ADVIL) 800 MG tablet  3 times daily        08/24/23 1554    ondansetron (ZOFRAN-ODT) 4 MG disintegrating tablet  Every 4 hours PRN        08/24/23 1554              Arby Barrette, MD 08/24/23 1603

## 2023-08-28 ENCOUNTER — Ambulatory Visit (INDEPENDENT_AMBULATORY_CARE_PROVIDER_SITE_OTHER): Admitting: Student

## 2023-08-28 VITALS — BP 131/82 | HR 82 | Ht 66.0 in | Wt 200.2 lb

## 2023-08-28 DIAGNOSIS — G43901 Migraine, unspecified, not intractable, with status migrainosus: Secondary | ICD-10-CM

## 2023-08-28 DIAGNOSIS — Z09 Encounter for follow-up examination after completed treatment for conditions other than malignant neoplasm: Secondary | ICD-10-CM | POA: Insufficient documentation

## 2023-08-28 MED ORDER — KETOROLAC TROMETHAMINE 30 MG/ML IJ SOLN
30.0000 mg | Freq: Once | INTRAMUSCULAR | Status: AC
  Administered 2023-08-28: 30 mg via INTRAMUSCULAR

## 2023-08-28 NOTE — Assessment & Plan Note (Addendum)
 Patient comes in with complaint of headache that has been persistent for last week, waxing and waning, worse with activity, throbbing sensation located at temples, blurry vision, dizziness.  Patient appreciates headaches better in the mornings, when she first wakes up, get worse as the day goes on/she starts moving.  Patient also appreciating photosensitivity.  Patient headaches sound most consistent with migraine.  Patient had normal neuroexam.  Patient given a shot of Toradol in clinic, denies any nausea with this no antiemetic given, and patient drove no Benadryl given.  Will recommend Tylenol hide dosing 3 times daily, and can use ibuprofen/NSAIDs starting tomorrow.  Patient also to follow-up with PCP for prophylactic medication and abortive medication. Pt started cycle yesterday, and is not sexually active, thus low concern for pregnancy.  - Toradol 30 mg x 1 IM - Patient to take Benadryl at home - Patient to follow-up with PCP for migraine prophylaxis/abortive

## 2023-08-28 NOTE — Progress Notes (Signed)
  SUBJECTIVE:   CHIEF COMPLAINT / HPI:   F/u Sinusitis -Seen in ED for facial pain, subj fevers > Sinutsitis > Augmentin  Today: Notes symptoms started on Thurday, began with rinorrhea and then began having dizziness and stomach upset. Emesis x 1, got tx w/ abx for sinus infection. Continues to have HA. Breathing from nose is better, but HA has continued. Having dizziness w/ HA and some blurriness. No fevers. Nose has finally cleared up and stomach is better, but noted emesis whenever she took abx. Hasn't thrown up today.   HA Began when symptoms started last week, has been persistent. Is better when she first wakes up. Takes meloxicam for HA. Is also noticing lights and motion make HA worse. Is having some blurry vision at times. Pain located in temples and is throbbing in nature. Patient notes she is not currently sexually active and started her period yesterday.    PERTINENT  PMH / PSH:    OBJECTIVE:  BP 131/82   Pulse 82   Ht 5\' 6"  (1.676 m)   Wt 200 lb 3.2 oz (90.8 kg)   SpO2 100%   BMI 32.31 kg/m  Physical Exam Eyes:     General: No visual field deficit. Neurological:     Mental Status: She is alert. Mental status is at baseline.     Cranial Nerves: Cranial nerves 2-12 are intact. No cranial nerve deficit, dysarthria or facial asymmetry.     Sensory: Sensation is intact. No sensory deficit.     Motor: Motor function is intact. No weakness or atrophy.     Coordination: Coordination is intact. Heel to Shin Test normal.      ASSESSMENT/PLAN:   Assessment & Plan Migraine with status migrainosus, not intractable, unspecified migraine type Patient comes in with complaint of headache that has been persistent for last week, waxing and waning, worse with activity, throbbing sensation located at temples, blurry vision, dizziness.  Patient appreciates headaches better in the mornings, when she first wakes up, get worse as the day goes on/she starts moving.  Patient also  appreciating photosensitivity.  Patient headaches sound most consistent with migraine.  Patient had normal neuroexam.  Patient given a shot of Toradol in clinic, denies any nausea with this no antiemetic given, and patient drove no Benadryl given.  Will recommend Tylenol hide dosing 3 times daily, and can use ibuprofen/NSAIDs starting tomorrow.  Patient also to follow-up with PCP for prophylactic medication and abortive medication. Pt started cycle yesterday, and is not sexually active, thus low concern for pregnancy.  - Toradol 30 mg x 1 IM - Patient to take Benadryl at home - Patient to follow-up with PCP for migraine prophylaxis/abortive Encounter for examination following treatment at hospital Patient comes in after ED visit for acute sinusitis.  Patient reports that she had difficulty tolerating the Augmentin, but that her symptoms have improved.  Patient reports that her nose is clearing up, and she is feeling less ill.  Suspect patient may have had viral infection.  Will have patient follow-up as needed. - Follow-up as needed - Continue to monitor No follow-ups on file. Bess Kinds, MD 08/28/2023, 3:15 PM PGY-3, [facility]

## 2023-08-28 NOTE — Patient Instructions (Addendum)
 It was great to see you! Thank you for allowing me to participate in your care!  I recommend that you always bring your medications to each appointment as this makes it easy to ensure we are on the correct medications and helps [place] not miss when refills are needed.  Our plans for today:  - Sinusitis Seems to have resolved as your symptoms are improving! Make follow up appointment if symptoms recur.  - Head Ache Sounds most like a migraine. We gave you a injection of Toradol to help break the headache.   Recovery today You can take some benadryl when you get home to help fall asleep tonight   Benadryl 20 mg today before bed/sleep   Pain Relif   Tylenol 1000 mg every 8 hours (no more than 3 x a day)    *max dose of tylenol in a day 3,000 mg    * Can start today      Ibuprofen 800 mg every 8 hours, no more than 3 x a day)    *max dose of 2,400 mg ibuprofen in a day    * Can start 08/29/23 (NO NSAIDS TODAY!)  Schedule an appointment with your PCP, Dr. Marjorie Smolder, about getting on migraine prophylaxis medication, and having a medication at home that will break a migraine   -Migraine daily preventative medicine  -Migraine abortion medicine    Take care and seek immediate care sooner if you develop any concerns.   Dr. Bess Kinds, MD [facility]

## 2023-08-28 NOTE — Assessment & Plan Note (Addendum)
 Patient comes in after ED visit for acute sinusitis.  Patient reports that she had difficulty tolerating the Augmentin, but that her symptoms have improved.  Patient reports that her nose is clearing up, and she is feeling less ill.  Suspect patient may have had viral infection.  Will have patient follow-up as needed. - Follow-up as needed - Continue to monitor

## 2023-11-28 ENCOUNTER — Encounter (HOSPITAL_COMMUNITY): Payer: Self-pay

## 2023-11-28 ENCOUNTER — Emergency Department (HOSPITAL_COMMUNITY): Payer: Worker's Compensation

## 2023-11-28 ENCOUNTER — Emergency Department (HOSPITAL_COMMUNITY)
Admission: EM | Admit: 2023-11-28 | Discharge: 2023-11-29 | Disposition: A | Discharge status: Home / Self Care | Payer: Worker's Compensation | Attending: Emergency Medicine | Admitting: Emergency Medicine

## 2023-11-28 ENCOUNTER — Other Ambulatory Visit: Payer: Self-pay

## 2023-11-28 DIAGNOSIS — T50904A Poisoning by unspecified drugs, medicaments and biological substances, undetermined, initial encounter: Secondary | ICD-10-CM | POA: Diagnosis not present

## 2023-11-28 DIAGNOSIS — R490 Dysphonia: Secondary | ICD-10-CM | POA: Insufficient documentation

## 2023-11-28 DIAGNOSIS — Y990 Civilian activity done for income or pay: Secondary | ICD-10-CM | POA: Diagnosis not present

## 2023-11-28 DIAGNOSIS — T1490XA Injury, unspecified, initial encounter: Secondary | ICD-10-CM

## 2023-11-28 DIAGNOSIS — T5991XA Toxic effect of unspecified gases, fumes and vapors, accidental (unintentional), initial encounter: Secondary | ICD-10-CM | POA: Diagnosis not present

## 2023-11-28 DIAGNOSIS — T594X1A Toxic effect of chlorine gas, accidental (unintentional), initial encounter: Secondary | ICD-10-CM | POA: Insufficient documentation

## 2023-11-28 DIAGNOSIS — Z77098 Contact with and (suspected) exposure to other hazardous, chiefly nonmedicinal, chemicals: Secondary | ICD-10-CM

## 2023-11-28 DIAGNOSIS — R609 Edema, unspecified: Secondary | ICD-10-CM | POA: Diagnosis not present

## 2023-11-28 DIAGNOSIS — R0689 Other abnormalities of breathing: Secondary | ICD-10-CM | POA: Diagnosis not present

## 2023-11-28 LAB — CBC WITH DIFFERENTIAL/PLATELET
Abs Immature Granulocytes: 0.13 10*3/uL — ABNORMAL HIGH (ref 0.00–0.07)
Basophils Absolute: 0 10*3/uL (ref 0.0–0.1)
Basophils Relative: 0 %
Eosinophils Absolute: 0 10*3/uL (ref 0.0–0.5)
Eosinophils Relative: 0 %
HCT: 34.6 % — ABNORMAL LOW (ref 36.0–46.0)
Hemoglobin: 11.7 g/dL — ABNORMAL LOW (ref 12.0–15.0)
Immature Granulocytes: 1 %
Lymphocytes Relative: 9 %
Lymphs Abs: 1.7 10*3/uL (ref 0.7–4.0)
MCH: 30.8 pg (ref 26.0–34.0)
MCHC: 33.8 g/dL (ref 30.0–36.0)
MCV: 91.1 fL (ref 80.0–100.0)
Monocytes Absolute: 1.2 10*3/uL — ABNORMAL HIGH (ref 0.1–1.0)
Monocytes Relative: 6 %
Neutro Abs: 17 10*3/uL — ABNORMAL HIGH (ref 1.7–7.7)
Neutrophils Relative %: 84 %
Platelets: 285 10*3/uL (ref 150–400)
RBC: 3.8 MIL/uL — ABNORMAL LOW (ref 3.87–5.11)
RDW: 12.3 % (ref 11.5–15.5)
WBC: 20 10*3/uL — ABNORMAL HIGH (ref 4.0–10.5)
nRBC: 0 % (ref 0.0–0.2)

## 2023-11-28 LAB — BASIC METABOLIC PANEL WITH GFR
Anion gap: 11 (ref 5–15)
BUN: 7 mg/dL (ref 6–20)
CO2: 22 mmol/L (ref 22–32)
Calcium: 9.5 mg/dL (ref 8.9–10.3)
Chloride: 105 mmol/L (ref 98–111)
Creatinine, Ser: 0.75 mg/dL (ref 0.44–1.00)
GFR, Estimated: >60 mL/min (ref >60)
Glucose, Bld: 94 mg/dL (ref 70–99)
Potassium: 2.8 mmol/L — ABNORMAL LOW (ref 3.5–5.1)
Sodium: 138 mmol/L (ref 135–145)

## 2023-11-28 MED ORDER — POTASSIUM CHLORIDE 10 MEQ/100ML IV SOLN
10.0000 meq | INTRAVENOUS | Status: AC
  Administered 2023-11-28 (×3): 10 meq via INTRAVENOUS
  Filled 2023-11-28 (×3): qty 100

## 2023-11-28 MED ORDER — ALBUTEROL SULFATE (2.5 MG/3ML) 0.083% IN NEBU
2.5000 mg | INHALATION_SOLUTION | Freq: Once | RESPIRATORY_TRACT | Status: AC
  Administered 2023-11-28: 2.5 mg via RESPIRATORY_TRACT
  Filled 2023-11-28: qty 3

## 2023-11-28 MED ORDER — SODIUM CHLORIDE 0.9 % IV SOLN: Freq: Once | INTRAVENOUS | Status: AC

## 2023-11-28 NOTE — Discharge Instructions (Addendum)
 1.  Take extra strength Tylenol  as needed for pain.  You may use nasal moisturizers such as Ocean nasal spray. 2.  Return to the emergency department if you have any increased difficulty breathing, develop fever or productive cough. 3.  Rest and stay well-hydrated for the next several days.  Avoid any possible irritants to your lungs such as smoke, chemicals or perfumes. 4.  See your doctor for recheck within the next 2 days.
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
## 2023-11-28 NOTE — ED Notes (Signed)
 Pt has been talking on the phone with no respiratory distress.
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
## 2023-12-28 ENCOUNTER — Encounter: Payer: Self-pay | Admitting: Family Medicine

## 2023-12-28 DIAGNOSIS — L309 Dermatitis, unspecified: Secondary | ICD-10-CM

## 2023-12-29 ENCOUNTER — Other Ambulatory Visit: Payer: Self-pay | Admitting: Family Medicine

## 2023-12-29 DIAGNOSIS — L309 Dermatitis, unspecified: Secondary | ICD-10-CM

## 2023-12-29 MED ORDER — FLUOCINONIDE 0.05 % EX OINT: 1.0000 | TOPICAL_OINTMENT | Freq: Two times a day (BID) | CUTANEOUS | 0 refills | Status: DC | PRN

## 2024-01-11 NOTE — Telephone Encounter (Signed)
 I recalculate her ASCCP risk with most recent PAP result. She is actually due for PAP now. Will reach out to her

## 2024-01-12 ENCOUNTER — Other Ambulatory Visit (HOSPITAL_COMMUNITY)
Admission: RE | Admit: 2024-01-12 | Discharge: 2024-01-12 | Disposition: A | Discharge status: Home / Self Care | Source: Ambulatory Visit | Attending: Family Medicine | Admitting: Family Medicine

## 2024-01-12 ENCOUNTER — Ambulatory Visit (INDEPENDENT_AMBULATORY_CARE_PROVIDER_SITE_OTHER): Admitting: Family Medicine

## 2024-01-12 ENCOUNTER — Telehealth: Payer: Self-pay

## 2024-01-12 VITALS — BP 125/85 | HR 77 | Ht 66.0 in | Wt 191.5 lb

## 2024-01-12 DIAGNOSIS — Z23 Encounter for immunization: Secondary | ICD-10-CM | POA: Diagnosis not present

## 2024-01-12 DIAGNOSIS — R87612 Low grade squamous intraepithelial lesion on cytologic smear of cervix (LGSIL): Secondary | ICD-10-CM | POA: Insufficient documentation

## 2024-01-12 DIAGNOSIS — N62 Hypertrophy of breast: Secondary | ICD-10-CM | POA: Diagnosis not present

## 2024-01-12 DIAGNOSIS — Z6830 Body mass index (BMI) 30.0-30.9, adult: Secondary | ICD-10-CM | POA: Diagnosis not present

## 2024-01-12 DIAGNOSIS — R519 Headache, unspecified: Secondary | ICD-10-CM | POA: Diagnosis not present

## 2024-01-12 DIAGNOSIS — Z0000 Encounter for general adult medical examination without abnormal findings: Secondary | ICD-10-CM

## 2024-01-12 DIAGNOSIS — E538 Deficiency of other specified B group vitamins: Secondary | ICD-10-CM | POA: Diagnosis not present

## 2024-01-12 DIAGNOSIS — E559 Vitamin D deficiency, unspecified: Secondary | ICD-10-CM | POA: Diagnosis not present

## 2024-01-12 DIAGNOSIS — G8929 Other chronic pain: Secondary | ICD-10-CM

## 2024-01-12 NOTE — Telephone Encounter (Signed)
-----   Message from [facility] Reena S sent at 01/12/2024  3:40 PM EDT ----- Manuelita to schedule CT at a [facility] facility.  Thanks! Margit
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
## 2024-01-12 NOTE — Telephone Encounter (Signed)
Error. Aquilla Solian, CMA

## 2024-01-12 NOTE — Assessment & Plan Note (Signed)
Lab checked.

## 2024-01-12 NOTE — Telephone Encounter (Signed)
 Spoke with patient. Informed of Ct Scan at [facility] Aug. 18th  at 4:00. Patient understood. Nelson Land, CMA

## 2024-01-12 NOTE — Patient Instructions (Addendum)
 Tension Headache, Adult A tension headache is a feeling of pain, pressure, or aching in the head. It is often felt over the front and sides of the head. Tension headaches can last from 30 minutes to several days. What are the causes? The cause of this condition is not known. Sometimes, tension headaches are brought on by stress, worry (anxiety), or depression. Other things that may set them off include: Alcohol. Too much caffeine or caffeine withdrawal. Colds, flu, or sinus infections. Dental problems. This can include clenching your teeth. Being tired. Holding your head and neck in the same position for a long time, such as while using a computer. Smoking. Arthritis in the neck. What are the signs or symptoms? Feeling pressure around the head. A dull ache in the head. Pain over the front and sides of the head. Feeling sore or tender in the muscles of the head, neck, and shoulders. How is this treated? This condition may be treated with lifestyle changes and with medicines that help relieve symptoms. Follow these instructions at home: Managing pain Take over-the-counter and prescription medicines only as told by your doctor. When you have a headache, lie down in a dark, quiet room. If told, put ice on your head and neck. To do this: Put ice in a plastic bag. Place a towel between your skin and the bag. Leave the ice on for 20 minutes, 2-3 times a day. Take off the ice if your skin turns bright red. This is very important. If you cannot feel pain, heat, or cold, you have a greater risk of damage to the area. If told, put heat on the back of your neck. Do this as often as told by your doctor. Use the heat source that your doctor recommends, such as a moist heat pack or a heating pad. Place a towel between your skin and the heat source. Leave the heat on for 20-30 minutes. Take off the heat if your skin turns bright red. This is very important. If you cannot feel pain, heat, or cold, you  have a greater risk of getting burned. Eating and drinking Eat meals on a regular schedule. If you drink alcohol: Limit how much you have to: 0-1 drink a day for women who are not pregnant. 0-2 drinks a day for men. Know how much alcohol is in your drink. In the [place] one drink equals one 12 oz bottle of beer (355 mL), one 5 oz glass of wine (148 mL), or one 1 oz glass of hard liquor (44 mL). Drink enough fluid to keep your pee (urine) pale yellow. Do not use a lot of caffeine, or stop using caffeine. Lifestyle Get 7-9 hours of sleep each night. Or get the amount of sleep that your doctor tells you to. At bedtime, keep computers, phones, and tablets out of your room. Find ways to lessen your stress. This may include: Exercise. Deep breathing. Yoga. Listening to music. Thinking positive thoughts. Sit up straight. Try to relax your muscles. Do not smoke or use any products that contain nicotine or tobacco. If you need help quitting, ask your doctor. General instructions  Avoid things that can bring on headaches. Keep a headache journal to see what may bring on headaches. For example, write down: What you eat and drink. How much sleep you get. Any change to your diet or medicines. Keep all follow-up visits. Contact a doctor if: Your headache does not get better. Your headache comes back. You have a headache, and sounds,  light, or smells bother you. You feel like you may vomit, or you vomit. Your stomach hurts. Get help right away if: You all of a sudden get a very bad headache with any of these things: A stiff neck. Feeling like you may vomit. Vomiting. Feeling mixed up (confused). Feeling weak in one part or one side of your body. Having trouble seeing or speaking, or both. Feeling short of breath. A rash. Feeling very sleepy. Pain in your eye or ear. Trouble walking or balancing. Feeling like you will faint, or you faint. Summary A tension headache is pain, pressure,  or aching in your head. Tension headaches can last from 30 minutes to several days. Lifestyle changes and medicines may help relieve pain. This information is not intended to replace advice given to you by your health care provider. Make sure you discuss any questions you have with your health care provider. Document Revised: 02/21/2023 Document Reviewed: 02/21/2023 Elsevier Patient Education  2024 [employer].

## 2024-01-12 NOTE — Assessment & Plan Note (Signed)
 Referred to Gen surg for breast reduction consultation

## 2024-01-13 LAB — BASIC METABOLIC PANEL WITH GFR
BUN/Creatinine Ratio: 9 (ref 9–23)
BUN: 8 mg/dL (ref 6–20)
CO2: 23 mmol/L (ref 20–29)
Calcium: 9.7 mg/dL (ref 8.7–10.2)
Chloride: 102 mmol/L (ref 96–106)
Creatinine, Ser: 0.87 mg/dL (ref 0.57–1.00)
Glucose: 76 mg/dL (ref 70–99)
Potassium: 3.8 mmol/L (ref 3.5–5.2)
Sodium: 138 mmol/L (ref 134–144)
eGFR: 97 mL/min/1.73 (ref >59)

## 2024-01-13 LAB — VITAMIN B12: Vitamin B-12: 336 pg/mL (ref 232–1245)

## 2024-01-13 LAB — VITAMIN D 25 HYDROXY (VIT D DEFICIENCY, FRACTURES): Vit D, 25-Hydroxy: 28 ng/mL — ABNORMAL LOW (ref 30.0–100.0)

## 2024-01-13 NOTE — Progress Notes (Signed)
 Patient has an CT scheduled 08/18 @4pm 

## 2024-01-14 ENCOUNTER — Ambulatory Visit: Payer: Self-pay | Admitting: Family Medicine

## 2024-01-19 ENCOUNTER — Encounter: Payer: Self-pay | Admitting: Family Medicine

## 2024-01-19 DIAGNOSIS — R87612 Low grade squamous intraepithelial lesion on cytologic smear of cervix (LGSIL): Secondary | ICD-10-CM | POA: Insufficient documentation

## 2024-01-19 LAB — CYTOLOGY - PAP
Chlamydia: NEGATIVE
Comment: NEGATIVE
Comment: NEGATIVE
Comment: NORMAL
Diagnosis: NEGATIVE
Diagnosis: REACTIVE
Neisseria Gonorrhea: NEGATIVE
Trichomonas: NEGATIVE

## 2024-01-26 ENCOUNTER — Ambulatory Visit (HOSPITAL_COMMUNITY)

## 2024-05-05 ENCOUNTER — Ambulatory Visit: Admitting: Family Medicine

## 2024-05-05 ENCOUNTER — Encounter: Payer: Self-pay | Admitting: Family Medicine

## 2024-05-05 VITALS — BP 129/79 | HR 89 | Ht 66.0 in | Wt 196.2 lb

## 2024-05-05 DIAGNOSIS — L309 Dermatitis, unspecified: Secondary | ICD-10-CM | POA: Diagnosis not present

## 2024-05-05 DIAGNOSIS — D229 Melanocytic nevi, unspecified: Secondary | ICD-10-CM

## 2024-05-05 MED ORDER — PREDNISONE 50 MG PO TABS: 50.0000 mg | ORAL_TABLET | Freq: Every day | ORAL | 0 refills | Status: AC

## 2024-05-05 MED ORDER — FLUOCINONIDE 0.05 % EX OINT: 1.0000 | TOPICAL_OINTMENT | Freq: Two times a day (BID) | CUTANEOUS | 3 refills | Status: AC | PRN

## 2024-05-05 NOTE — Assessment & Plan Note (Signed)
 Atopic dermatitis (eczema) with acute flare Acute flare with widespread involvement, likely exacerbated by stress and improper skin care. Previous treatments were insufficient; a severe flare requires intervention. - Prescribed oral prednisolone 50 mg QD for 5 days. - Refilled Lidex  cream, 60 grams. - Advised combining Lidex  with Eucerin cream. - Instructed to message via MyChart if no improvement in two weeks.

## 2024-05-05 NOTE — Progress Notes (Signed)
    SUBJECTIVE:   CHIEF COMPLAINT / HPI:   Discussed the use of AI scribe software for clinical note transcription with the patient, who gave verbal consent to proceed.  History of Present Illness   Rose Butler is a 23 year old female with eczema who presents with a severe eczema flare-up.  She is experiencing a severe eczema flare-up that has spread across her body, including her back, breasts, thighs, the back of her legs, inside of her arms, and the bottom of her face along the jawline. She describes the condition as 'really bad' and notes that she is 'all scarred up'.  She attributes the flare-up to stress and a lack of education on proper skin care products, noting that her skin is 'really sensitive'. She has been using Aquaphor, which initially helped, but now seems ineffective as she still experiences itching and scratching. Coconut oil is absorbed quickly and leaves her skin dry within an hour. Eucerin lotion was initially helpful but is no longer effective. She ran out of her prescribed ointment in early August and has not been able to refill it, which she believes contributed to the flare-up.  Her current medication regimen includes Lidex  (fluocinonide ) cream, which she uses twice daily for eczema. She finds it helpful but runs out quickly, as a small tube lasts only two days. She has also tried over-the-counter cortisone from [facility], which was effective when used in conjunction with the ointment but not on its own.  Additionally, she mentions a mole on the left side of her neck that has been present since birth. Recently, it bled slightly, and she managed to stop the bleeding by applying a Band-Aid for two days. The mole has not changed in size, but pieces of it are flaking off. She is concerned about accidentally scratching it due to her eczema.  No current use of antibiotics, meloxicam , Zofran , or ibuprofen .       PERTINENT  PMH / PSH: PMHx reviewed  OBJECTIVE:   BP 129/79    Pulse 89   Ht 5' 6 (1.676 m)   Wt 196 lb 3.2 oz (89 kg)   SpO2 100%   BMI 31.67 kg/m   Physical Exam   HEENT: Round, raised, soft lesion on the left side of her neck, inferior to the jaw, hyperpigmented, non-tender, non-erythematous.  HEART: No murmurs. RRR CHEST: Lungs clear to auscultation. SKIN: Maculopapular lesions with erythema on the elbow creases, chest, and breasts bilaterally. Hyperpigmented lesion on the inner thigh without erythema. Severe hyperpigmentation on the posterior knee creases.     Chaperone - Thersia Meissner        ASSESSMENT/PLAN:   Assessment & Plan Eczema, unspecified type Atopic dermatitis (eczema) with acute flare Acute flare with widespread involvement, likely exacerbated by stress and improper skin care. Previous treatments were insufficient; a severe flare requires intervention. - Prescribed oral prednisolone 50 mg QD for 5 days. - Refilled Lidex  cream, 60 grams. - Advised combining Lidex  with Eucerin cream. - Instructed to message via MyChart if no improvement in two weeks. 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 No significant change in size. - Photographed nevus for monitoring. - Advised to monitor for changes. - Consider shaving in the dermatology clinic if growth or symptoms occur.    She declined COVID and flu shots  Otto Fairly, MD [facility]

## 2024-05-05 NOTE — Assessment & Plan Note (Addendum)
 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 No significant change in size. - Photographed nevus for monitoring. - Advised to monitor for changes. - Consider shaving in the dermatology clinic if growth or symptoms occur.

## 2024-05-05 NOTE — Patient Instructions (Signed)
 Inflamed Skin (Atopic Dermatitis): What to Know Atopic dermatitis is a skin condition that causes dry, itchy, and inflamed skin. It's the most common type of eczema, which is a group of skin conditions that make your skin feel rough and puffy. This condition often gets worse in the winter and better in the summer. Atopic dermatitis usually starts in childhood and can last into adulthood. It's not contagious, so it does not spread from person to person. Your symptoms may get worse when you're having a flare-up. During a flare-up, your symptoms may get worse and bother you. What are the causes? The exact cause of this condition isn't known. Flare-ups can be triggered by: Contact with things you're sensitive or allergic to. Stress. Some foods. Very hot or cold weather. Harsh chemicals and soaps. Dry air. Chlorine. What increases the risk? You're more likely to get this condition if you have a personal or family history of: Eczema. Allergies. Asthma. Hay fever. What are the signs or symptoms?  Dry, scaly skin. Red, brown, purple, or grayish rash. Itchiness. Thick and cracked skin over time. How is this diagnosed? This condition is diagnosed based on: Symptoms. Physical exam. Medical history. How is this treated? There's no cure for this condition, but you can manage your symptoms. Do this by: Controlling your itchiness and scratching with antihistamine medicine or steroid creams. Avoiding allergens or triggers. Managing stress. Trying light therapy, also called phototherapy if other treatments don't work or if it's all over your body. Follow these instructions at home: Skin care  Keep your skin hydrated. To do this: Use unscented lotions that contain petroleum. Avoid lotions with alcohol or water. These can dry out your skin more. Take short baths or showers (less than 5 minutes). Use warm water instead of hot water. Use mild, unscented soaps. Avoid bubble bath. Put lotion on  right after bathing. Do not put anything on your skin without checking with your health care provider. General instructions Take or apply your medicines only as told. Wear clothes made of cotton or cotton blends. Dress lightly to avoid itching that can be caused by heat. When doing laundry, rinse your clothes twice to remove all soap. Use soap that doesn't have dyes and perfumes. Avoid triggers that cause flare-ups. Avoid scratching. It can make the rash and itching worse and can lead to infection. Keep fingernails short to avoid scratching open the skin. Avoid people who have cold sores or fever blisters. These infections can make your condition worse. Keep all follow-up visits to make sure your treatment plan is working. Contact a health care provider if: Your itching affects your sleep. Your rash gets worse or doesn't get better after a week of treatment. You have a fever. You have a rash after being around someone with cold sores or fever blisters. You have warmth or pus in the rash area. You have soft yellow scabs in the rash area. This information is not intended to replace advice given to you by your health care provider. Make sure you discuss any questions you have with your health care provider. Document Revised: 10/29/2022 Document Reviewed: 10/29/2022 Elsevier Patient Education  2024 [employer].

## 2024-05-10 ENCOUNTER — Encounter: Payer: Self-pay | Admitting: Family Medicine
